# Patient Record
Sex: Female | Born: 1965
Health system: Southern US, Community
[De-identification: ages and names within clinical notes are randomized; demographics above are authoritative.]

## PROBLEM LIST (undated history)

## (undated) DIAGNOSIS — E876 Hypokalemia: Secondary | ICD-10-CM

## (undated) DIAGNOSIS — I10 Essential (primary) hypertension: Secondary | ICD-10-CM

## (undated) DIAGNOSIS — E669 Obesity, unspecified: Secondary | ICD-10-CM

## (undated) DIAGNOSIS — E119 Type 2 diabetes mellitus without complications: Secondary | ICD-10-CM

## (undated) DIAGNOSIS — M75101 Unspecified rotator cuff tear or rupture of right shoulder, not specified as traumatic: Secondary | ICD-10-CM

## (undated) HISTORY — PX: ROTATOR CUFF REPAIR: SHX139

## (undated) HISTORY — DX: Essential (primary) hypertension: I10

## (undated) HISTORY — PX: TUBAL LIGATION: SHX77

## (undated) HISTORY — PX: ENDOMETRIAL ABLATION: SHX621

---

## 2010-08-27 ENCOUNTER — Ambulatory Visit: Payer: Self-pay | Admitting: Radiology

## 2010-08-27 ENCOUNTER — Emergency Department (HOSPITAL_BASED_OUTPATIENT_CLINIC_OR_DEPARTMENT_OTHER)
Admission: EM | Admit: 2010-08-27 | Discharge: 2010-08-27 | Payer: Self-pay | Source: Home / Self Care | Admitting: Emergency Medicine

## 2011-01-05 LAB — DIFFERENTIAL
Basophils Relative: 2 % — ABNORMAL HIGH (ref 0–1)
Eosinophils Absolute: 0.2 10*3/uL (ref 0.0–0.7)
Lymphs Abs: 3.3 10*3/uL (ref 0.7–4.0)
Monocytes Absolute: 1.1 10*3/uL — ABNORMAL HIGH (ref 0.1–1.0)
Monocytes Relative: 8 % (ref 3–12)
Neutro Abs: 9.1 10*3/uL — ABNORMAL HIGH (ref 1.7–7.7)
Neutrophils Relative %: 66 % (ref 43–77)

## 2011-01-05 LAB — POCT CARDIAC MARKERS
CKMB, poc: 1.1 ng/mL (ref 1.0–8.0)
CKMB, poc: 1.5 ng/mL (ref 1.0–8.0)
Myoglobin, poc: 72.6 ng/mL (ref 12–200)
Myoglobin, poc: 89.3 ng/mL (ref 12–200)

## 2011-01-05 LAB — CBC
HCT: 39.4 % (ref 36.0–46.0)
Hemoglobin: 13.4 g/dL (ref 12.0–15.0)
MCH: 28.1 pg (ref 26.0–34.0)
MCHC: 34 g/dL (ref 30.0–36.0)
RBC: 4.78 MIL/uL (ref 3.87–5.11)

## 2011-01-05 LAB — BASIC METABOLIC PANEL
CO2: 25 mEq/L (ref 19–32)
Calcium: 9.9 mg/dL (ref 8.4–10.5)
Chloride: 105 mEq/L (ref 96–112)
GFR calc Af Amer: >60 mL/min (ref >60)
Glucose, Bld: 111 mg/dL — ABNORMAL HIGH (ref 70–99)
Potassium: 3.7 mEq/L (ref 3.5–5.1)
Sodium: 145 mEq/L (ref 135–145)

## 2011-04-23 ENCOUNTER — Emergency Department (HOSPITAL_BASED_OUTPATIENT_CLINIC_OR_DEPARTMENT_OTHER)
Admission: EM | Admit: 2011-04-23 | Discharge: 2011-04-23 | Disposition: A | Discharge status: Home / Self Care | Payer: PRIVATE HEALTH INSURANCE | Attending: Emergency Medicine | Admitting: Emergency Medicine

## 2011-04-23 ENCOUNTER — Emergency Department (INDEPENDENT_AMBULATORY_CARE_PROVIDER_SITE_OTHER): Payer: PRIVATE HEALTH INSURANCE

## 2011-04-23 DIAGNOSIS — M79609 Pain in unspecified limb: Secondary | ICD-10-CM

## 2011-04-23 DIAGNOSIS — S92309A Fracture of unspecified metatarsal bone(s), unspecified foot, initial encounter for closed fracture: Secondary | ICD-10-CM | POA: Insufficient documentation

## 2011-04-23 DIAGNOSIS — Y92009 Unspecified place in unspecified non-institutional (private) residence as the place of occurrence of the external cause: Secondary | ICD-10-CM | POA: Insufficient documentation

## 2011-04-23 DIAGNOSIS — X500XXA Overexertion from strenuous movement or load, initial encounter: Secondary | ICD-10-CM | POA: Insufficient documentation

## 2011-04-23 DIAGNOSIS — W19XXXA Unspecified fall, initial encounter: Secondary | ICD-10-CM

## 2011-04-23 DIAGNOSIS — I10 Essential (primary) hypertension: Secondary | ICD-10-CM | POA: Insufficient documentation

## 2011-04-23 DIAGNOSIS — K219 Gastro-esophageal reflux disease without esophagitis: Secondary | ICD-10-CM | POA: Insufficient documentation

## 2011-04-27 ENCOUNTER — Encounter: Payer: Self-pay | Admitting: Family Medicine

## 2011-04-27 ENCOUNTER — Ambulatory Visit (INDEPENDENT_AMBULATORY_CARE_PROVIDER_SITE_OTHER): Payer: PRIVATE HEALTH INSURANCE | Admitting: Family Medicine

## 2011-04-27 VITALS — BP 146/98 | HR 85 | Temp 98.1°F | Ht 65.0 in | Wt 226.6 lb

## 2011-04-27 DIAGNOSIS — S99199A Other physeal fracture of unspecified metatarsal, initial encounter for closed fracture: Secondary | ICD-10-CM

## 2011-04-27 DIAGNOSIS — S92309A Fracture of unspecified metatarsal bone(s), unspecified foot, initial encounter for closed fracture: Secondary | ICD-10-CM

## 2011-04-27 NOTE — Patient Instructions (Signed)
You have a [place]-type fracture of your 5th metatarsal of your foot. This can be treated without surgery IF you wear the boot at all times and put NO weight on this foot for 6-8 weeks. Must use either crutches or a knee walker to get around if this is the case. The other option if this will be too difficulty is to have surgery to put a screw into this bone to prevent the fracture site from moving - chances of this healing up in 6-8 weeks is higher if you have surgery but surgery has costs, risks associated with this. In your case, either option is reasonable. If going to try without surgery, I need to see you back in 1 week to repeat x-rays (and after that see you every 2 weeks for x-rays).

## 2011-04-27 NOTE — Progress Notes (Signed)
  Subjective:    Patient ID: Julie [name], female    DOB: 05-16-1966, 44 y.o.   MRN: [id number]  PCP: None  HPI 45 yo F here with right foot fracture.  Patient reports she was fishing on Friday, 6/29. She ran forward without shoes on to grab pole, inverted right foot and fell down. Immediate pain, swelling lateral aspect of right foot and couldn't bear weight. No prior right foot injuries or surgeries. Went to ED and diagnosed with 5th MT fracture - placed in boot, given crutches, and referred here. She is not wearing the boot because this is hot, too large for her.  No crutches here today either. Not icing - has been taking pain medicines from ED.  Past Medical History  Diagnosis Date  . Hypertension     No current outpatient prescriptions on file prior to visit.    History reviewed. No pertinent past surgical history.  No Known Allergies  History   Social History  . Marital Status: Married    Spouse Name: N/A    Number of Children: N/A  . Years of Education: N/A   Occupational History  . Not on file.   Social History Main Topics  . Smoking status: Never Smoker   . Smokeless tobacco: Not on file  . Alcohol Use: Not on file  . Drug Use: Not on file  . Sexually Active: Not on file   Other Topics Concern  . Not on file   Social History Narrative  . No narrative on file    Family History  Problem Relation Age of Onset  . Hypertension Mother   . Hypertension Father   . Diabetes Father   . Hypertension Sister   . Heart attack Neg Hx   . Sudden death Neg Hx   . Hyperlipidemia Neg Hx     BP 146/98  Pulse 85  Temp(Src) 98.1 F (36.7 C) (Oral)  Ht 5\' 5"  (1.651 m)  Wt 226 lb 9.6 oz (102.785 kg)  BMI 37.71 kg/m2  Review of Systems See HPI above.    Objective:   Physical Exam Gen: NAD R foot: Mod swelling lateral right foot.  No bruising, erythema, skin breakdown, other deformity. TTP base 5th MT.  No navicular, posterior med/lat malleoli, fibular  head, or other foot/ankle TTP. Able to dorsiflex and plantarflex foot - did not test extent of ROM or int/ext rotation with known Halm fracture. Neg talar tilt and ant drawer NVI distally with < 2 sec cap refill, 2+ dp pulses.     Assessment & Plan:  1. Right nondisplaced 5th MT [place] fracture - we reviewed the options (surgery vs non-weightbearing for 6-8 weeks) for management and patient would like to try non-surgical approach.  Stressed the extreme importance of not putting any weight on right foot even in boot (this is to protect the area in case of fall and to prevent any movement.  Ok to take off boot to ice only.  Crutches to help with ambulation.  Note for work - must be sitting and no weight put on right foot.  We will see her back in 1 week, repeat x-rays at that time, assess compliance with treatment protocol.  If not compliant, displacement > 2mm, or no evidence healing after 6 weeks, will refer for consideration of ORIF.  Patient agrees with treatment plan.

## 2011-04-27 NOTE — Assessment & Plan Note (Signed)
Right nondisplaced 5th MT [place] fracture - we reviewed the options (surgery vs non-weightbearing for 6-8 weeks) for management and patient would like to try non-surgical approach.  Stressed the extreme importance of not putting any weight on right foot even in boot (this is to protect the area in case of fall and to prevent any movement.  Ok to take off boot to ice only.  Crutches to help with ambulation.  Note for work - must be sitting and no weight put on right foot.  We will see her back in 1 week, repeat x-rays at that time, assess compliance with treatment protocol.  If not compliant, displacement > 2mm, or no evidence healing after 6 weeks, will refer for consideration of ORIF.  Patient agrees with treatment plan.

## 2011-05-06 ENCOUNTER — Encounter: Payer: Self-pay | Admitting: Family Medicine

## 2011-05-06 ENCOUNTER — Ambulatory Visit (INDEPENDENT_AMBULATORY_CARE_PROVIDER_SITE_OTHER): Payer: PRIVATE HEALTH INSURANCE | Admitting: Family Medicine

## 2011-05-06 ENCOUNTER — Ambulatory Visit (HOSPITAL_BASED_OUTPATIENT_CLINIC_OR_DEPARTMENT_OTHER)
Admission: RE | Admit: 2011-05-06 | Discharge: 2011-05-06 | Disposition: A | Discharge status: Home / Self Care | Payer: PRIVATE HEALTH INSURANCE | Source: Ambulatory Visit | Attending: Family Medicine | Admitting: Family Medicine

## 2011-05-06 VITALS — BP 141/90 | HR 81 | Temp 98.2°F | Ht 65.0 in | Wt 230.0 lb

## 2011-05-06 DIAGNOSIS — M79671 Pain in right foot: Secondary | ICD-10-CM

## 2011-05-06 DIAGNOSIS — IMO0001 Reserved for inherently not codable concepts without codable children: Secondary | ICD-10-CM | POA: Insufficient documentation

## 2011-05-06 DIAGNOSIS — S99199A Other physeal fracture of unspecified metatarsal, initial encounter for closed fracture: Secondary | ICD-10-CM

## 2011-05-06 DIAGNOSIS — S92309A Fracture of unspecified metatarsal bone(s), unspecified foot, initial encounter for closed fracture: Secondary | ICD-10-CM

## 2011-05-06 DIAGNOSIS — M79609 Pain in unspecified limb: Secondary | ICD-10-CM

## 2011-05-06 NOTE — Patient Instructions (Signed)
You have a [place]-type fracture of your 5th metatarsal of your foot. Wear boot at all times, try not to put any weight on it as much as possible. Must use either crutches. I will discuss your case with one of my colleagues. Ice your foot 3-4 times a day 15 minutes at a time for swelling and pain. Elevate as much as possible. Follow up with me in 2 weeks for a recheck and to repeat x-rays.

## 2011-05-06 NOTE — Progress Notes (Addendum)
Subjective:    Patient ID: Julie [name], female    DOB: [birth year], 45 y.o.   MRN: [id number]  PCP: None  HPI  45 yo F here for 9 day f/u right [place] fracture.  Last OV 7/3: Patient reports she was fishing on Friday, 6/29. She ran forward without shoes on to grab pole, inverted right foot and fell down. Immediate pain, swelling lateral aspect of right foot and couldn't bear weight. No prior right foot injuries or surgeries. Went to ED and diagnosed with 5th MT fracture - placed in boot, given crutches, and referred here. She is not wearing the boot because this is hot, too large for her.  No crutches here today either. Not icing - has been taking pain medicines from ED.  Today 7/12: Patient has been wearing cam walker and using crutches. Despite warning of consequences and discussion that she needs to be non-weightbearing with this fracture, she states she has been putting weight on foot in cam walker. Reports with her job even with restrictions they would have to take her out of work where she wouldn't get paid or she has to be standing for her job. Pain is significantly better, now a 0/10, only mild soreness. Denies any prior foot fractures or injuries that she knows of.  Past Medical History  Diagnosis Date  . Hypertension     No current outpatient prescriptions on file prior to visit.    History reviewed. No pertinent past surgical history.  No Known Allergies  History   Social History  . Marital Status: Married    Spouse Name: N/A    Number of Children: N/A  . Years of Education: N/A   Occupational History  . Not on file.   Social History Main Topics  . Smoking status: Never Smoker   . Smokeless tobacco: Not on file  . Alcohol Use: Not on file  . Drug Use: Not on file  . Sexually Active: Not on file   Other Topics Concern  . Not on file   Social History Narrative  . No narrative on file    Family History  Problem Relation Age of Onset  .  Hypertension Mother   . Hypertension Father   . Diabetes Father   . Hypertension Sister   . Heart attack Neg Hx   . Sudden death Neg Hx   . Hyperlipidemia Neg Hx     BP 141/90  Pulse 81  Temp(Src) 98.2 F (36.8 C) (Oral)  Ht 5\' 5"  (1.651 m)  Wt 230 lb (104.327 kg)  BMI 38.27 kg/m2  Review of Systems  See HPI above.    Objective:   Physical Exam  Gen: NAD R foot: No swelling lateral right foot.  No bruising, erythema, skin breakdown, other deformity. Minimal TTP base 5th MT.  No navicular, posterior med/lat malleoli, fibular head, or other foot/ankle TTP. Able to dorsiflex and plantarflex foot - did not test extent of ROM or int/ext rotation with known Moler fracture. Neg talar tilt and ant drawer NVI distally with < 2 sec cap refill, 2+ dp pulses.     Assessment & Plan:  1. Right nondisplaced 5th MT [place] fracture - no displacement at fracture site but no callus formation.  Again reiterated need for her to be non-weight bearing for optimal treatment of this fracture.  This is complicated by patient not having insurance (she reports she wouldn't even be able to consider surgery financially until the end of August) and financially she  has to work in a job that has no desk work available (or seated work) without financial support if she were to be out of work.  Will try to treat conservatively but will review the case with one of my colleagues.  If after 6-8 weeks of conservative care no callus seen and patient has pain at fracture site, will need surgical intervention.    Addendum:  Discussed case with Dr. Darrick Penna - will attempt to treat conservatively as above especially with limited resources and options, patient's pain being mostly improved.  Spoke with patient regarding this and informed - she requested some pain medicine so will call in 40 hydrocodone/tylenol to her pharmacy [facility] [address] in [place].  To keep f/u with me in 2 weeks.  Patient agrees to treatment plan.

## 2011-05-06 NOTE — Assessment & Plan Note (Signed)
Right nondisplaced 5th MT [place] fracture - no displacement at fracture site but no callus formation.  Again reiterated need for her to be non-weight bearing for optimal treatment of this fracture.  This is complicated by patient not having insurance (she reports she wouldn't even be able to consider surgery financially until the end of August) and financially she has to work in a job that has no desk work available (or seated work) without financial support if she were to be out of work.  Will try to treat conservatively but will review the case with one of my colleagues.  If after 6-8 weeks of conservative care no callus seen and patient has pain at fracture site, will need surgical intervention.

## 2011-05-21 ENCOUNTER — Ambulatory Visit (INDEPENDENT_AMBULATORY_CARE_PROVIDER_SITE_OTHER): Payer: PRIVATE HEALTH INSURANCE | Admitting: Family Medicine

## 2011-05-21 ENCOUNTER — Ambulatory Visit: Payer: PRIVATE HEALTH INSURANCE | Admitting: Family Medicine

## 2011-05-21 ENCOUNTER — Encounter: Payer: Self-pay | Admitting: Family Medicine

## 2011-05-21 ENCOUNTER — Ambulatory Visit (HOSPITAL_BASED_OUTPATIENT_CLINIC_OR_DEPARTMENT_OTHER)
Admission: RE | Admit: 2011-05-21 | Discharge: 2011-05-21 | Disposition: A | Discharge status: Home / Self Care | Payer: PRIVATE HEALTH INSURANCE | Source: Ambulatory Visit | Attending: Family Medicine | Admitting: Family Medicine

## 2011-05-21 VITALS — BP 119/86 | HR 72 | Temp 97.4°F | Ht 65.0 in | Wt 230.0 lb

## 2011-05-21 DIAGNOSIS — M79671 Pain in right foot: Secondary | ICD-10-CM

## 2011-05-21 DIAGNOSIS — Z4789 Encounter for other orthopedic aftercare: Secondary | ICD-10-CM | POA: Insufficient documentation

## 2011-05-21 DIAGNOSIS — M79609 Pain in unspecified limb: Secondary | ICD-10-CM

## 2011-05-21 DIAGNOSIS — S99199A Other physeal fracture of unspecified metatarsal, initial encounter for closed fracture: Secondary | ICD-10-CM

## 2011-05-21 DIAGNOSIS — S92309A Fracture of unspecified metatarsal bone(s), unspecified foot, initial encounter for closed fracture: Secondary | ICD-10-CM

## 2011-05-21 NOTE — Progress Notes (Signed)
Subjective:    Patient ID: Julie [name], female    DOB: 1966-02-13, 45 y.o.   MRN: [id number]  PCP: None  HPI  45 yo F here for 4 week f/u right Fay fracture.  7/3: Patient reports she was fishing on Friday, 6/29. She ran forward without shoes on to grab pole, inverted right foot and fell down. Immediate pain, swelling lateral aspect of right foot and couldn't bear weight. No prior right foot injuries or surgeries. Went to ED and diagnosed with 5th MT fracture - placed in boot, given crutches, and referred here. She is not wearing the boot because this is hot, too large for her.  No crutches here today either. Not icing - has been taking pain medicines from ED.  7/12: Patient has been wearing cam walker and using crutches. Despite warning of consequences and discussion that she needs to be non-weightbearing with this fracture, she states she has been putting weight on foot in cam walker. Reports with her job even with restrictions they would have to take her out of work where she wouldn't get paid or she has to be standing for her job. Pain is significantly better, now a 0/10, only mild soreness. Denies any prior foot fractures or injuries that she knows of.  7/27: Patient is now about 4 weeks out from initial injury. She still reports 0/10 pain with only mild soreness. As noted before, patient is in a tough position - needs to work to make money but if placed on restrictions, they would take her out of work. Despite not being completely adherent to medical treatment, she wanted to try non-operative treatment for [place] fracture. She is using crutches and wearing cam walker but not using crutches all the time. Has vicodin as needed for severe pain. No longer icing.  Past Medical History  Diagnosis Date  . Hypertension     No current outpatient prescriptions on file prior to visit.    History reviewed. No pertinent past surgical history.  No Known Allergies  History     Social History  . Marital Status: Married    Spouse Name: N/A    Number of Children: N/A  . Years of Education: N/A   Occupational History  . Not on file.   Social History Main Topics  . Smoking status: Never Smoker   . Smokeless tobacco: Not on file  . Alcohol Use: Not on file  . Drug Use: Not on file  . Sexually Active: Not on file   Other Topics Concern  . Not on file   Social History Narrative  . No narrative on file    Family History  Problem Relation Age of Onset  . Hypertension Mother   . Hypertension Father   . Diabetes Father   . Hypertension Sister   . Heart attack Neg Hx   . Sudden death Neg Hx   . Hyperlipidemia Neg Hx     BP 119/86  Pulse 72  Temp(Src) 97.4 F (36.3 C) (Oral)  Ht 5\' 5"  (1.651 m)  Wt 230 lb (104.327 kg)  BMI 38.27 kg/m2  Review of Systems  See HPI above.    Objective:   Physical Exam  Gen: NAD R foot: No swelling lateral right foot.  No bruising, erythema, skin breakdown, other deformity. No TTP base 5th MT.  No navicular, posterior med/lat malleoli, fibular head, or other foot/ankle TTP. Able to dorsiflex and plantarflex foot - did not test extent of ROM or int/ext rotation with  known Maduro fracture. Neg talar tilt and ant drawer NVI distally with < 2 sec cap refill, 2+ dp pulses.     Assessment & Plan:  1. Right 5th MT [place] fracture - patient only describes occasional mild soreness at fracture site.  X-rays today show widening at fracture site though some bone bridging - no lateral callus formation.  Discussed my concern about x-ray appearance today.  Stressed importance of being non-weight bearing (ideally completely as we discussed previously).  Will see her for follow-up in 2 weeks when she is 6 weeks out.  But if at that point still having soreness and x-rays not improved at that point, I advised patient I would likely refer her for surgical consultation to consider ORIF.    At last visit patient reported to me she  wouldn't be able to consider surgery financially until the end of August.  She has to work in a job that has no desk work available (or seated work) without financial support if she were to be out of work.

## 2011-05-21 NOTE — Assessment & Plan Note (Signed)
Right 5th MT Ringer fracture - patient only describes occasional mild soreness at fracture site.  X-rays today show widening at fracture site though some bone bridging - no lateral callus formation.  Discussed my concern about x-ray appearance today.  Stressed importance of being non-weight bearing (ideally completely as we discussed previously).  Will see her for follow-up in 2 weeks when she is 6 weeks out.  But if at that point still having soreness and x-rays not improved at that point, I advised patient I would likely refer her for surgical consultation to consider ORIF.    At last visit patient reported to me she wouldn't be able to consider surgery financially until the end of August.  She has to work in a job that has no desk work available (or seated work) without financial support if she were to be out of work.

## 2011-06-04 ENCOUNTER — Ambulatory Visit (INDEPENDENT_AMBULATORY_CARE_PROVIDER_SITE_OTHER): Payer: PRIVATE HEALTH INSURANCE | Admitting: Family Medicine

## 2011-06-04 ENCOUNTER — Encounter: Payer: Self-pay | Admitting: Family Medicine

## 2011-06-04 ENCOUNTER — Ambulatory Visit (HOSPITAL_BASED_OUTPATIENT_CLINIC_OR_DEPARTMENT_OTHER)
Admission: RE | Admit: 2011-06-04 | Discharge: 2011-06-04 | Disposition: A | Discharge status: Home / Self Care | Payer: PRIVATE HEALTH INSURANCE | Source: Ambulatory Visit | Attending: Family Medicine | Admitting: Family Medicine

## 2011-06-04 ENCOUNTER — Encounter: Payer: Self-pay | Admitting: *Deleted

## 2011-06-04 VITALS — BP 149/98 | HR 72 | Temp 98.1°F | Ht 65.0 in | Wt 234.0 lb

## 2011-06-04 DIAGNOSIS — X58XXXA Exposure to other specified factors, initial encounter: Secondary | ICD-10-CM | POA: Insufficient documentation

## 2011-06-04 DIAGNOSIS — M79671 Pain in right foot: Secondary | ICD-10-CM

## 2011-06-04 DIAGNOSIS — IMO0002 Reserved for concepts with insufficient information to code with codable children: Secondary | ICD-10-CM

## 2011-06-04 DIAGNOSIS — S99199A Other physeal fracture of unspecified metatarsal, initial encounter for closed fracture: Secondary | ICD-10-CM

## 2011-06-04 DIAGNOSIS — M79609 Pain in unspecified limb: Secondary | ICD-10-CM

## 2011-06-04 DIAGNOSIS — S8290XS Unspecified fracture of unspecified lower leg, sequela: Secondary | ICD-10-CM

## 2011-06-04 DIAGNOSIS — M19079 Primary osteoarthritis, unspecified ankle and foot: Secondary | ICD-10-CM | POA: Insufficient documentation

## 2011-06-04 DIAGNOSIS — M7730 Calcaneal spur, unspecified foot: Secondary | ICD-10-CM | POA: Insufficient documentation

## 2011-06-04 DIAGNOSIS — S92309A Fracture of unspecified metatarsal bone(s), unspecified foot, initial encounter for closed fracture: Secondary | ICD-10-CM

## 2011-06-04 NOTE — Assessment & Plan Note (Signed)
Right 5th MT [place] fracture - x-rays reviewed today, now 6 weeks out from initial injury and still without evidence of union.  While she has tried conservative approach (non-adherent despite stressing importance of being NWB on crutches) and she felt necessary to trial this given she couldn't consider surgery financially (and couldn't afford to be out of work) until late August, union unlikely at this point without ORIF.  Will refer her to orthopedics for discussion of surgical intervention.  She stated she prefers an afternoon appointment for initial consultation.

## 2011-06-04 NOTE — Progress Notes (Signed)
Subjective:    Patient ID: Julie [name], female    DOB: Mar 08, 1966, 45 y.o.   MRN: [id number]  PCP: None  HPI  45 yo F here for 4 week f/u right Bieker fracture.  7/3: Patient reports she was fishing on Friday, 6/29. She ran forward without shoes on to grab pole, inverted right foot and fell down. Immediate pain, swelling lateral aspect of right foot and couldn't bear weight. No prior right foot injuries or surgeries. Went to ED and diagnosed with 5th MT fracture - placed in boot, given crutches, and referred here. She is not wearing the boot because this is hot, too large for her.  No crutches here today either. Not icing - has been taking pain medicines from ED.  7/12: Patient has been wearing cam walker and using crutches. Despite warning of consequences and discussion that she needs to be non-weightbearing with this fracture, she states she has been putting weight on foot in cam walker. Reports with her job even with restrictions they would have to take her out of work where she wouldn't get paid or she has to be standing for her job. Pain is significantly better, now a 0/10, only mild soreness. Denies any prior foot fractures or injuries that she knows of.  7/27: Patient is now about 4 weeks out from initial injury. She still reports 0/10 pain with only mild soreness. As noted before, patient is in a tough position - needs to work to make money but if placed on restrictions, they would take her out of work. Despite not being completely adherent to medical treatment, she wanted to try non-operative treatment for [name] fracture. She is using crutches and wearing cam walker but not using crutches all the time. Has vicodin as needed for severe pain. No longer icing.  8/10: No real change since last visit. Patient reports 0/10 pain but with intermittent pain at fracture site. As noted before, patient is in a tough position - needs to work to make money but if placed on  restrictions, they would take her out of work.  She noted that she believes her benefits would kick in in 2 weeks at which point she would be able to have surgery and be out of work without fear of losing her job. Still wearing cam walker, only using one crutch currently, not completely adherent to non-weight bearing though this has been stressed. Not taking any medicine for pain currently.  Past Medical History  Diagnosis Date  . Hypertension     No current outpatient prescriptions on file prior to visit.    History reviewed. No pertinent past surgical history.  No Known Allergies  History   Social History  . Marital Status: Married    Spouse Name: N/A    Number of Children: N/A  . Years of Education: N/A   Occupational History  . Not on file.   Social History Main Topics  . Smoking status: Never Smoker   . Smokeless tobacco: Not on file  . Alcohol Use: Not on file  . Drug Use: Not on file  . Sexually Active: Not on file   Other Topics Concern  . Not on file   Social History Narrative  . No narrative on file    Family History  Problem Relation Age of Onset  . Hypertension Mother   . Hypertension Father   . Diabetes Father   . Hypertension Sister   . Heart attack Neg Hx   . Sudden death  Neg Hx   . Hyperlipidemia Neg Hx     BP 149/98  Pulse 72  Temp(Src) 98.1 F (36.7 C) (Oral)  Ht 5\' 5"  (1.651 m)  Wt 234 lb (106.142 kg)  BMI 38.94 kg/m2  Review of Systems  See HPI above.    Objective:   Physical Exam  Gen: NAD R foot: No swelling lateral right foot.  No bruising, erythema, skin breakdown, other deformity. Mild TTP base 5th MT.  No navicular, posterior med/lat malleoli, fibular head, or other foot/ankle TTP. Able to dorsiflex and plantarflex foot - did not test extent of ROM or int/ext rotation with known Tony fracture. Neg talar tilt and ant drawer. NVI distally with < 2 sec cap refill, 2+ dp pulses.     Assessment & Plan:  1. Right 5th MT  Persons fracture - x-rays reviewed today, now 6 weeks out from initial injury and still without evidence of union.  While she has tried conservative approach (non-adherent despite stressing importance of being NWB on crutches) and she felt necessary to trial this given she couldn't consider surgery financially (and couldn't afford to be out of work) until late August, union unlikely at this point without ORIF.  Will refer her to orthopedics for discussion of surgical intervention.  She stated she prefers an afternoon appointment for initial consultation.

## 2011-06-04 NOTE — Patient Instructions (Addendum)
Orthopaedic appt Dr Rondall Allegra, August 14th @ 5pm [address] [place], [place]. Pt informed via phone

## 2011-07-05 ENCOUNTER — Emergency Department (HOSPITAL_BASED_OUTPATIENT_CLINIC_OR_DEPARTMENT_OTHER)
Admission: EM | Admit: 2011-07-05 | Discharge: 2011-07-06 | Disposition: A | Discharge status: Home / Self Care | Payer: PRIVATE HEALTH INSURANCE | Attending: Emergency Medicine | Admitting: Emergency Medicine

## 2011-07-05 ENCOUNTER — Encounter (HOSPITAL_BASED_OUTPATIENT_CLINIC_OR_DEPARTMENT_OTHER): Payer: Self-pay

## 2011-07-05 DIAGNOSIS — M7989 Other specified soft tissue disorders: Secondary | ICD-10-CM | POA: Insufficient documentation

## 2011-07-05 DIAGNOSIS — M25559 Pain in unspecified hip: Secondary | ICD-10-CM | POA: Insufficient documentation

## 2011-07-05 DIAGNOSIS — I10 Essential (primary) hypertension: Secondary | ICD-10-CM | POA: Insufficient documentation

## 2011-07-05 NOTE — ED Notes (Signed)
Being treated for foot fx since June-states she wears "the boot" when working-no boot in place upon arrival-c/o swelling to right thigh

## 2011-07-06 MED ORDER — NAPROXEN 500 MG PO TABS: 500.0000 mg | ORAL_TABLET | Freq: Two times a day (BID) | ORAL | Status: AC

## 2011-07-06 NOTE — ED Provider Notes (Signed)
History  Scribed for Dr. Deretha Emory, the patient was seen in room 2. The chart was scribed by Gilman Schmidt. The patients care was started at 1359.  CSN: [id number] Arrival date & time: 07/05/2011 10:16 PM  Chief Complaint  Patient presents with  . Leg Swelling   HPI Julie [name] is a 45 y.o. female who presents to the Emergency Department complaining of right leg swelling. Pt reports that she fractured her right leg in June. States that she wears cast boot but reports that she cannot drive with boot and is not currently wearing it. Additionally, patient reports tightness in right calf. Denies any CP, difficulty breathing, or abdominal pain. There are no other associated symptoms and no other alleviating or aggravating factors.   HPI ELEMENTS:  Location: left leg Duration: persistent since onset Timing: constant  Context: as above  Associated symptoms: denies any CP, difficulty breathing, or abdominal pain.   PAST MEDICAL HISTORY:  Past Medical History  Diagnosis Date  . Hypertension      PAST SURGICAL HISTORY:  History reviewed. No pertinent past surgical history.   MEDICATIONS:  Previous Medications   METOPROLOL-HYDROCHLOROTHIAZIDE (LOPRESSOR HCT) 50-25 MG PER TABLET    Take 1 tablet by mouth daily.       ALLERGIES:  Allergies as of 07/05/2011  . (No Known Allergies)     FAMILY HISTORY:  Family History  Problem Relation Age of Onset  . Hypertension Mother   . Hypertension Father   . Diabetes Father   . Hypertension Sister   . Heart attack Neg Hx   . Sudden death Neg Hx   . Hyperlipidemia Neg Hx      SOCIAL HISTORY: History  Substance Use Topics  . Smoking status: Never Smoker   . Smokeless tobacco: Not on file  . Alcohol Use: No      Review of Systems  Respiratory:       Denies difficulty breathing  Cardiovascular: Negative for chest pain.  Gastrointestinal: Negative for abdominal pain.  Musculoskeletal:       Right thigh swelling    Physical Exam    BP 149/80  Pulse 76  Temp(Src) 98.1 F (36.7 C) (Oral)  Resp 18  Ht 5\' 5"  (1.651 m)  Wt 230 lb (104.327 kg)  BMI 38.27 kg/m2  SpO2 100%  LMP 06/26/2011  Physical Exam  Nursing note and vitals reviewed. Constitutional: She is oriented to person, place, and time. She appears well-developed and well-nourished.  HENT:  Head: Normocephalic and atraumatic.  Eyes: Conjunctivae and EOM are normal. Pupils are equal, round, and reactive to light.  Neck: Normal range of motion. Neck supple.  Cardiovascular: Normal rate, regular rhythm, normal heart sounds and intact distal pulses.   Pulmonary/Chest: Effort normal and breath sounds normal.  Abdominal: Soft. Bowel sounds are normal.  Musculoskeletal: Normal range of motion.  Neurological: She is alert and oriented to person, place, and time.  Skin: Skin is warm and dry.  Psychiatric: She has a normal mood and affect.   OTHER DATA REVIEWED: Nursing notes, vital signs, and past medical records reviewed.   DIAGNOSTIC STUDIES: Oxygen Saturation is 100% on room air, normal by my interpretation.     MDM:   SUSPECT RIGHT INNER THIGH MUSCLE STRAIN SWELLING FROM USING RIGHT LEG DIFFERENTLY DUE TO RIGHT FOOT FRACTURE. HAS ORTHO FOLLOW UP, NO ANKLE OR CALF SWELLING THEREFOR DOUBT DVT SINCE NOW DISTAL SWELLING.   IMPRESSION: Diagnoses that have been ruled out:  Diagnoses that are still  under consideration:  Final diagnoses:    PLAN:  Home. The patient is to return the emergency department if there is any worsening of symptoms. I have reviewed the discharge instructions with the patient.  CONDITION ON DISCHARGE: Stable  MEDICATIONS GIVEN IN THE E.D.  Medications  metoprolol-hydrochlorothiazide (LOPRESSOR HCT) 50-25 MG per tablet (not administered)    DISCHARGE MEDICATIONS: New Prescriptions   No medications on file    SCRIBE ATTESTATION:  I personally performed the services described in this documentation, which was scribed  in my presence. The recorded information has been reviewed and considered. No att. providers found    Procedures        Shelda Jakes, MD 07/06/11 [phone]

## 2011-08-31 ENCOUNTER — Emergency Department (HOSPITAL_BASED_OUTPATIENT_CLINIC_OR_DEPARTMENT_OTHER)
Admission: EM | Admit: 2011-08-31 | Discharge: 2011-08-31 | Disposition: A | Discharge status: Home / Self Care | Payer: PRIVATE HEALTH INSURANCE | Attending: Emergency Medicine | Admitting: Emergency Medicine

## 2011-08-31 ENCOUNTER — Encounter (HOSPITAL_BASED_OUTPATIENT_CLINIC_OR_DEPARTMENT_OTHER): Payer: Self-pay

## 2011-08-31 ENCOUNTER — Emergency Department (INDEPENDENT_AMBULATORY_CARE_PROVIDER_SITE_OTHER): Payer: PRIVATE HEALTH INSURANCE

## 2011-08-31 DIAGNOSIS — R05 Cough: Secondary | ICD-10-CM

## 2011-08-31 DIAGNOSIS — R110 Nausea: Secondary | ICD-10-CM | POA: Insufficient documentation

## 2011-08-31 DIAGNOSIS — R61 Generalized hyperhidrosis: Secondary | ICD-10-CM

## 2011-08-31 DIAGNOSIS — IMO0001 Reserved for inherently not codable concepts without codable children: Secondary | ICD-10-CM | POA: Insufficient documentation

## 2011-08-31 DIAGNOSIS — M791 Myalgia, unspecified site: Secondary | ICD-10-CM

## 2011-08-31 DIAGNOSIS — R070 Pain in throat: Secondary | ICD-10-CM

## 2011-08-31 DIAGNOSIS — R42 Dizziness and giddiness: Secondary | ICD-10-CM

## 2011-08-31 LAB — URINALYSIS, ROUTINE W REFLEX MICROSCOPIC
Bilirubin Urine: NEGATIVE
Glucose, UA: NEGATIVE mg/dL
Ketones, ur: NEGATIVE mg/dL
Protein, ur: NEGATIVE mg/dL
pH: 7.5 (ref 5.0–8.0)

## 2011-08-31 MED ORDER — PROMETHAZINE HCL 25 MG PO TABS: 25.0000 mg | ORAL_TABLET | Freq: Four times a day (QID) | ORAL | Status: AC | PRN

## 2011-08-31 NOTE — ED Notes (Signed)
Pt reports generalized body aches, nausea and sweats that started on Saturday.

## 2011-08-31 NOTE — ED Provider Notes (Signed)
History     CSN: [id number] Arrival date & time: 08/31/2011  2:18 PM   First MD Initiated Contact with Patient 08/31/11 1413      Chief Complaint  Patient presents with  . Generalized Body Aches  . Nausea    (Consider location/radiation/quality/duration/timing/severity/associated sxs/prior treatment) Patient is a 45 y.o. female presenting with URI. The history is provided by the patient. No language interpreter was used.  URI The primary symptoms include headaches, sore throat, nausea and myalgias. Primary symptoms do not include fever or cough. The current episode started 3 to 5 days ago. This is a new problem. The problem has not changed since onset. Symptoms associated with the illness include chills. The illness is not associated with facial pain, sinus pressure, congestion or rhinorrhea.    Past Medical History  Diagnosis Date  . Hypertension     Past Surgical History  Procedure Date  . Tubal ligation     Family History  Problem Relation Age of Onset  . Hypertension Mother   . Hypertension Father   . Diabetes Father   . Hypertension Sister   . Heart attack Neg Hx   . Sudden death Neg Hx   . Hyperlipidemia Neg Hx     History  Substance Use Topics  . Smoking status: Never Smoker   . Smokeless tobacco: Not on file  . Alcohol Use: No    OB History    Grav Para Term Preterm Abortions TAB SAB Ect Mult Living                  Review of Systems  Constitutional: Positive for chills. Negative for fever.  HENT: Positive for sore throat. Negative for congestion, rhinorrhea and sinus pressure.   Respiratory: Negative for cough.   Gastrointestinal: Positive for nausea.  Musculoskeletal: Positive for myalgias.  Neurological: Positive for headaches.  All other systems reviewed and are negative.    Allergies  Review of patient's allergies indicates no known allergies.  Home Medications   Current Outpatient Rx  Name Route Sig Dispense Refill  .  METOPROLOL-HYDROCHLOROTHIAZIDE 50-25 MG PO TABS Oral Take 1 tablet by mouth daily.      Marland Kitchen NAPROXEN 500 MG PO TABS Oral Take 1 tablet (500 mg total) by mouth 2 (two) times daily. 14 tablet 0    BP 151/104  Pulse 72  Temp(Src) 97.9 F (36.6 C) (Oral)  Resp 18  Ht 5\' 5"  (1.651 m)  Wt 235 lb (106.595 kg)  BMI 39.11 kg/m2  SpO2 100%  LMP 06/28/2011  Physical Exam  Nursing note and vitals reviewed. Constitutional: She is oriented to person, place, and time. She appears well-developed and well-nourished.  HENT:  Head: Normocephalic and atraumatic.  Right Ear: External ear normal.  Left Ear: External ear normal.  Mouth/Throat: Posterior oropharyngeal erythema present.  Eyes: Pupils are equal, round, and reactive to light.  Neck: Normal range of motion. Neck supple.  Cardiovascular: Normal rate and regular rhythm.   Pulmonary/Chest: Effort normal and breath sounds normal.  Abdominal: Soft. Bowel sounds are normal.  Musculoskeletal: Normal range of motion.  Neurological: She is alert and oriented to person, place, and time.  Skin: Skin is warm and dry.  Psychiatric: She has a normal mood and affect.    ED Course  Procedures (including critical care time)   Labs Reviewed  PREGNANCY, URINE  URINALYSIS, ROUTINE W REFLEX MICROSCOPIC   Dg Chest 2 View  08/31/2011  *RADIOLOGY REPORT*  Clinical Data: Cough, sore throat,  dizziness, sweating, history hypertension  CHEST - 2 VIEW  Comparison: 08/27/2010  Findings: Upper-normal size of cardiac silhouette. Mediastinal contours and pulmonary vascularity normal. Lungs clear. No pleural effusion or pneumothorax. No acute osseous findings.  IMPRESSION: No acute abnormalities.  Original Report Authenticated By: Lollie Marrow, M.D.     1. Nausea   2. Myalgia       MDM  No acute findings here:pt is non septic in appearance:pt is tolerating po here without any problem:pt is requesting something for nausea at home   Medical screening  examination/treatment/procedure(s) were performed by non-physician practitioner and as supervising physician I was immediately available for consultation/collaboration. Osvaldo Human, M.D.     Teressa Lower, NP 08/31/11 1531  Carleene Cooper III, MD 09/01/11 2101

## 2011-10-24 ENCOUNTER — Encounter (HOSPITAL_BASED_OUTPATIENT_CLINIC_OR_DEPARTMENT_OTHER): Payer: Self-pay

## 2011-10-24 ENCOUNTER — Emergency Department (HOSPITAL_BASED_OUTPATIENT_CLINIC_OR_DEPARTMENT_OTHER)
Admission: EM | Admit: 2011-10-24 | Discharge: 2011-10-24 | Disposition: A | Discharge status: Home / Self Care | Payer: PRIVATE HEALTH INSURANCE | Attending: Emergency Medicine | Admitting: Emergency Medicine

## 2011-10-24 DIAGNOSIS — I10 Essential (primary) hypertension: Secondary | ICD-10-CM | POA: Insufficient documentation

## 2011-10-24 DIAGNOSIS — B9789 Other viral agents as the cause of diseases classified elsewhere: Secondary | ICD-10-CM | POA: Insufficient documentation

## 2011-10-24 DIAGNOSIS — B349 Viral infection, unspecified: Secondary | ICD-10-CM

## 2011-10-24 DIAGNOSIS — IMO0001 Reserved for inherently not codable concepts without codable children: Secondary | ICD-10-CM | POA: Insufficient documentation

## 2011-10-24 DIAGNOSIS — R079 Chest pain, unspecified: Secondary | ICD-10-CM | POA: Insufficient documentation

## 2011-10-24 MED ORDER — IBUPROFEN 800 MG PO TABS
ORAL_TABLET | ORAL | Status: AC
  Administered 2011-10-24: 800 mg via ORAL
  Filled 2011-10-24: qty 1

## 2011-10-24 MED ORDER — IBUPROFEN 800 MG PO TABS
800.0000 mg | ORAL_TABLET | Freq: Once | ORAL | Status: AC
  Administered 2011-10-24: 800 mg via ORAL

## 2011-10-24 MED ORDER — OSELTAMIVIR PHOSPHATE 75 MG PO CAPS: 75.0000 mg | ORAL_CAPSULE | Freq: Two times a day (BID) | ORAL | Status: AC

## 2011-10-24 NOTE — ED Provider Notes (Signed)
History     CSN: [id number]  Arrival date & time 10/24/11  1403   First MD Initiated Contact with Patient 10/24/11 1441      Chief Complaint  Patient presents with  . Generalized Body Aches    (Consider location/radiation/quality/duration/timing/severity/associated sxs/prior treatment) Patient is a 45 y.o. female presenting with cough. The history is provided by the patient. No language interpreter was used.  Cough This is a new problem. The current episode started more than 2 days ago. The problem occurs constantly. The cough is non-productive. The maximum temperature recorded prior to her arrival was 101 to 101.9 F. The fever has been present for 1 to 2 days. Associated symptoms include chest pain, rhinorrhea and sore throat. She has tried nothing for the symptoms. She is not a smoker. Her past medical history does not include pneumonia.  Pt complains of a cough and bodyaches for 2 days.    Past Medical History  Diagnosis Date  . Hypertension     Past Surgical History  Procedure Date  . Tubal ligation     Family History  Problem Relation Age of Onset  . Hypertension Mother   . Hypertension Father   . Diabetes Father   . Hypertension Sister   . Heart attack Neg Hx   . Sudden death Neg Hx   . Hyperlipidemia Neg Hx     History  Substance Use Topics  . Smoking status: Never Smoker   . Smokeless tobacco: Not on file  . Alcohol Use: No    OB History    Grav Para Term Preterm Abortions TAB SAB Ect Mult Living                  Review of Systems  HENT: Positive for sore throat and rhinorrhea.   Respiratory: Positive for cough.   Cardiovascular: Positive for chest pain.  All other systems reviewed and are negative.    Allergies  Review of patient's allergies indicates no known allergies.  Home Medications   Current Outpatient Rx  Name Route Sig Dispense Refill  . ACETAMINOPHEN 500 MG PO TABS Oral Take 500 mg by mouth every 6 (six) hours as needed. For  pain     . METOPROLOL-HYDROCHLOROTHIAZIDE 50-25 MG PO TABS Oral Take 1 tablet by mouth daily.      Marland Kitchen NAPROXEN 500 MG PO TABS Oral Take 1 tablet (500 mg total) by mouth 2 (two) times daily. 14 tablet 0    BP 142/104  Pulse 132  Temp(Src) 101.2 F (38.4 C) (Oral)  Resp 18  Ht 5\' 5"  (1.651 m)  Wt 230 lb (104.327 kg)  BMI 38.27 kg/m2  SpO2 100%  LMP 10/23/2011  Physical Exam  Nursing note and vitals reviewed. Constitutional: She is oriented to person, place, and time. She appears well-developed and well-nourished.  HENT:  Head: Normocephalic and atraumatic.  Right Ear: External ear normal.  Left Ear: External ear normal.  Nose: Nose normal.  Mouth/Throat: Oropharynx is clear and moist.  Eyes: Conjunctivae and EOM are normal. Pupils are equal, round, and reactive to light.  Neck: Normal range of motion. Neck supple.  Cardiovascular: Normal rate.   Pulmonary/Chest: Effort normal.  Abdominal: Soft.  Musculoskeletal: Normal range of motion.  Neurological: She is alert and oriented to person, place, and time. She has normal reflexes.  Skin: Skin is warm.  Psychiatric: She has a normal mood and affect.    ED Course  Procedures (including critical care time)  Labs Reviewed -  No data to display No results found.   No diagnosis found.    MDM  I suspect viral illness,  Pt did not have a flu shot.       Langston Masker, [place] 10/24/11 [phone]

## 2011-10-24 NOTE — ED Notes (Signed)
Body aches, cough x 2 days

## 2011-10-25 NOTE — ED Provider Notes (Signed)
History/physical exam/procedure(s) were performed by non-physician practitioner and as supervising physician I was immediately available for consultation/collaboration. I have reviewed all notes and am in agreement with care and plan.   James Senn S Wilford Merryfield, MD 10/25/11 1611 

## 2013-07-17 ENCOUNTER — Encounter (HOSPITAL_BASED_OUTPATIENT_CLINIC_OR_DEPARTMENT_OTHER): Payer: Self-pay | Admitting: Emergency Medicine

## 2013-07-17 ENCOUNTER — Emergency Department (HOSPITAL_BASED_OUTPATIENT_CLINIC_OR_DEPARTMENT_OTHER)
Admission: EM | Admit: 2013-07-17 | Discharge: 2013-07-17 | Disposition: A | Discharge status: Home / Self Care | Payer: PRIVATE HEALTH INSURANCE | Attending: Emergency Medicine | Admitting: Emergency Medicine

## 2013-07-17 ENCOUNTER — Emergency Department (HOSPITAL_BASED_OUTPATIENT_CLINIC_OR_DEPARTMENT_OTHER): Payer: PRIVATE HEALTH INSURANCE

## 2013-07-17 DIAGNOSIS — M549 Dorsalgia, unspecified: Secondary | ICD-10-CM | POA: Insufficient documentation

## 2013-07-17 DIAGNOSIS — M25569 Pain in unspecified knee: Secondary | ICD-10-CM | POA: Insufficient documentation

## 2013-07-17 DIAGNOSIS — M25469 Effusion, unspecified knee: Secondary | ICD-10-CM | POA: Insufficient documentation

## 2013-07-17 DIAGNOSIS — Z79899 Other long term (current) drug therapy: Secondary | ICD-10-CM | POA: Insufficient documentation

## 2013-07-17 DIAGNOSIS — R51 Headache: Secondary | ICD-10-CM | POA: Insufficient documentation

## 2013-07-17 DIAGNOSIS — M25561 Pain in right knee: Secondary | ICD-10-CM

## 2013-07-17 DIAGNOSIS — I10 Essential (primary) hypertension: Secondary | ICD-10-CM | POA: Insufficient documentation

## 2013-07-17 MED ORDER — HYDROCODONE-ACETAMINOPHEN 5-325 MG PO TABS: 1.0000 | ORAL_TABLET | ORAL | Status: DC | PRN

## 2013-07-17 MED ORDER — IBUPROFEN 800 MG PO TABS
800.0000 mg | ORAL_TABLET | Freq: Once | ORAL | Status: AC
  Administered 2013-07-17: 800 mg via ORAL
  Filled 2013-07-17: qty 1

## 2013-07-17 MED ORDER — IBUPROFEN 800 MG PO TABS: 800.0000 mg | ORAL_TABLET | Freq: Three times a day (TID) | ORAL | Status: DC

## 2013-07-17 NOTE — ED Provider Notes (Signed)
CSN: [id number]     Arrival date & time 07/17/13  1844 History   This chart was scribed for Audree Camel, MD by Valera Castle, ED Scribe. This patient was seen in room MH11/MH11 and the patient's care was started at 7:26 PM.     Chief Complaint  Patient presents with  . Knee Pain    Patient is a 47 y.o. female presenting with knee pain. The history is provided by the patient. No language interpreter was used.  Knee Pain Location:  Knee Time since incident:  1 week Knee location:  R knee Pain details:    Radiates to:  Does not radiate   Severity:  Moderate   Onset quality:  Gradual   Duration:  1 week   Timing:  Constant   Progression:  Worsening Chronicity:  New Associated symptoms: back pain, decreased ROM and swelling   Associated symptoms: no fever, no muscle weakness and no numbness    HPI Comments: Julie [name] is a 47 y.o. female with a h/o hypertension who presents to the Emergency Department complaining of gradual, moderate, constant right knee pain, with associated swelling and redness, onset 1 week ago, with no known injury. She reports associated headaches and back pain. She reports that the knee pain is worsened when she ambulates and tries to bend it. She reports taking tylenol and iburpofen, with no relief.  She denies any h/o of knee problems. She denies fever, or any other associated symptoms. She has no known allergies. She has h/o tubal ligation, but denies any other medical history.    Past Medical History  Diagnosis Date  . Hypertension    Past Surgical History  Procedure Laterality Date  . Tubal ligation     Family History  Problem Relation Age of Onset  . Hypertension Mother   . Hypertension Father   . Diabetes Father   . Hypertension Sister   . Heart attack Neg Hx   . Sudden death Neg Hx   . Hyperlipidemia Neg Hx    History  Substance Use Topics  . Smoking status: Never Smoker   . Smokeless tobacco: Not on file  . Alcohol Use: No   OB  History   Grav Para Term Preterm Abortions TAB SAB Ect Mult Living                 Review of Systems  Constitutional: Negative for fever.  Musculoskeletal: Positive for back pain and arthralgias (Right knee pain.).  Neurological: Positive for headaches.  All other systems reviewed and are negative.    Allergies  Review of patient's allergies indicates no known allergies.  Home Medications   Current Outpatient Rx  Name  Route  Sig  Dispense  Refill  . acetaminophen (TYLENOL) 500 MG tablet   Oral   Take 500 mg by mouth every 6 (six) hours as needed. For pain          . metoprolol-hydrochlorothiazide (LOPRESSOR HCT) 50-25 MG per tablet   Oral   Take 1 tablet by mouth daily.            Triage Vitals: BP 139/80  Pulse 93  Temp(Src) 98.5 F (36.9 C) (Oral)  Resp 20  Ht 5\' 6"  (1.676 m)  Wt 227 lb (102.967 kg)  BMI 36.66 kg/m2  SpO2 100%  Physical Exam  Nursing note and vitals reviewed. Constitutional: She is oriented to person, place, and time. She appears well-developed and well-nourished. No distress.  HENT:  Head:  Normocephalic and atraumatic.  Eyes: EOM are normal.  Neck: Neck supple. No tracheal deviation present.  Cardiovascular: Normal rate, regular rhythm and normal heart sounds.   +2 DP pulses.   Pulmonary/Chest: Effort normal. No respiratory distress. She has no wheezes. She has no rales.  Abdominal: Soft. There is no tenderness.  Musculoskeletal: Normal range of motion.  No swelling or tenderness to right calf or foot. Tenderness to medial inferior right knee.   Neurological: She is alert and oriented to person, place, and time.  Normal sensation. Normal strength.   Skin: Skin is warm and dry.  Psychiatric: She has a normal mood and affect. Her behavior is normal.    ED Course  Procedures (including critical care time)  DIAGNOSTIC STUDIES: Oxygen Saturation is 100% on room air, normal by my interpretation.    COORDINATION OF CARE: 7:32  PM-Discussed treatment plan which includes a right knee x-ray and ibuprofen with pt at bedside and pt agreed to plan.     Labs Review Labs Reviewed - No data to display Imaging Review Dg Knee Complete 4 Views Right  07/17/2013   CLINICAL DATA:  Knee pain  EXAM: RIGHT KNEE - COMPLETE 4+ VIEW  COMPARISON:  None.  FINDINGS: There is no evidence of fracture, dislocation, or joint effusion. There is minimal tibial spine osteophytosis. Soft tissues are unremarkable.  IMPRESSION: No acute fracture or dislocation.   Electronically Signed   By: Sherian Rein   On: 07/17/2013 19:59    MDM   1. Right knee pain    Patient's knee x-ray shows no fractures or joint effusion. There is no obvious effusion on exam either. No warmth or erythema to suggest infection. The location of patient's pain does raise the possibility of pes anserine bursitis. We'll treat symptomatically with NSAIDs, ice, rest, and short course of narcotics for breakthrough pain. Discussed her to return precautions including fever, erythema, swelling, or any other concerning symptoms. Will have her followup with PCP and/or orthopedics.    I personally performed the services described in this documentation, which was scribed in my presence. The recorded information has been reviewed and is accurate.    Audree Camel, MD 07/17/13 2152

## 2013-07-17 NOTE — ED Notes (Signed)
Pain in right knee since last week with no known injury.

## 2014-07-24 ENCOUNTER — Encounter (HOSPITAL_BASED_OUTPATIENT_CLINIC_OR_DEPARTMENT_OTHER): Payer: Self-pay | Admitting: Emergency Medicine

## 2014-07-24 ENCOUNTER — Emergency Department (HOSPITAL_BASED_OUTPATIENT_CLINIC_OR_DEPARTMENT_OTHER)
Admission: EM | Admit: 2014-07-24 | Discharge: 2014-07-24 | Disposition: A | Discharge status: Home / Self Care | Payer: PRIVATE HEALTH INSURANCE | Attending: Emergency Medicine | Admitting: Emergency Medicine

## 2014-07-24 DIAGNOSIS — Y929 Unspecified place or not applicable: Secondary | ICD-10-CM | POA: Insufficient documentation

## 2014-07-24 DIAGNOSIS — Y939 Activity, unspecified: Secondary | ICD-10-CM | POA: Diagnosis not present

## 2014-07-24 DIAGNOSIS — IMO0002 Reserved for concepts with insufficient information to code with codable children: Secondary | ICD-10-CM | POA: Insufficient documentation

## 2014-07-24 DIAGNOSIS — T148XXA Other injury of unspecified body region, initial encounter: Secondary | ICD-10-CM

## 2014-07-24 DIAGNOSIS — R296 Repeated falls: Secondary | ICD-10-CM | POA: Diagnosis not present

## 2014-07-24 DIAGNOSIS — I10 Essential (primary) hypertension: Secondary | ICD-10-CM | POA: Insufficient documentation

## 2014-07-24 DIAGNOSIS — Z79899 Other long term (current) drug therapy: Secondary | ICD-10-CM | POA: Diagnosis not present

## 2014-07-24 DIAGNOSIS — S20229A Contusion of unspecified back wall of thorax, initial encounter: Secondary | ICD-10-CM | POA: Insufficient documentation

## 2014-07-24 MED ORDER — IBUPROFEN 800 MG PO TABS: 800.0000 mg | ORAL_TABLET | Freq: Three times a day (TID) | ORAL | Status: DC

## 2014-07-24 MED ORDER — TRAMADOL HCL 50 MG PO TABS: 50.0000 mg | ORAL_TABLET | Freq: Four times a day (QID) | ORAL | Status: DC | PRN

## 2014-07-24 MED ORDER — TRAMADOL HCL 50 MG PO TABS
50.0000 mg | ORAL_TABLET | Freq: Once | ORAL | Status: AC
  Administered 2014-07-24: 50 mg via ORAL
  Filled 2014-07-24: qty 1

## 2014-07-24 MED ORDER — IBUPROFEN 800 MG PO TABS
800.0000 mg | ORAL_TABLET | Freq: Once | ORAL | Status: AC
  Administered 2014-07-24: 800 mg via ORAL
  Filled 2014-07-24: qty 1

## 2014-07-24 NOTE — ED Notes (Signed)
Pt c/o fall on tailbone x 2 days ago

## 2014-07-24 NOTE — Discharge Instructions (Signed)
Chondral Injury Localized injuries to the surface of joints are known as chondral injuries. In chondral injuries, the articular cartilage sustains an injury. This may or may not involve the separation of the articular cartilage from the bone. Chondral injuries do not involve an injury to the underlying bone. These injuries can occur in any joint, although the knee joint is the most commonly injured, followed by the ankle, elbow, and shoulder. Chondral injuries occur most frequently in adolescent males. Chondral injuries are difficult to treat because cartilage has a limited ability to heal.  SYMPTOMS   Pain, swelling, or pain or tenderness in the affected joint.  Giving way, locking, or catching of the joint.  Feeling of a free-floating piece of cartilage in the joint.  A crackling sound (crepitation) within the joint with motion.  Often, injuries to other structures within the joint (ligament tears or meniscus injury). CAUSES  Direct trauma (impaction, avulsion, or shearing or rotational forces) to the joint.  RISK INCREASES WITH:  Participation in contact sports or sports in which playing on, and falling on, hard surfaces may occur.  Adolescence.  Other knee injury (anterior cruciate ligament [ACL] or meniscus tear).  Poor physical strength and flexibility of the joint. PREVENTION  Wear protective equipment (knee, elbow, or shoulder pads) to soften direct trauma.  Wear appropriate-length footwear (cleats for field sports) for the playing surface.  Warm up and stretch properly before physical activity.  Maintain appropriate physical fitness:  Flexibility, strength, and endurance of muscles around joints.  Cardiovascular fitness. PROGNOSIS  The prognosis of chondral injuries is dependent on the size of the lesion (injury). Small chondral lesions may not cause problems. Large and/or deep chondral lesions are more problematic because cartilage does not have the ability to heal.  Chondral injuries may go on to develop arthritis of the joint. Usually the symptoms resolve with appropriate treatment, which can include removal of or fixing loose pieces of cartilage.  RELATED COMPLICATIONS   Frequent recurrence of symptoms, resulting in chronic pain and swelling.  Arthritis of the affected joint.  Loose bodies that cause locking of affected joint. TREATMENT  Treatment initially consists of taking medication and applying ice to reduce pain and inflammation of the affected joint. For injuries to the joints of the leg (knee or ankle), walking with crutches (still allow weight to be placed on the leg) until you walk without a limp is often recommended. You may be asked to perform range-of-motion, stretching, and strengthening exercises. These exercises may be carried out at home, although you may be given a referral to a therapist. Occasionally it may be recommend that you wear a brace, cast, or crutches (for the knee or ankle) to protect or immobilize the joint. If pain persists despite conservative treatment or if loose fragments are present within the joint, surgery is usually recommended. The surgery is typically performed arthroscopically to remove the loose fragments or to re-attach the fragment (if large enough and not deformed). After immobilization or surgery it is important to stretch and strengthen the weakened joint and surrounding muscles (due to the injury, surgery, or the immobilization). This may be done with or without the assistance of a therapist. MEDICATION   If pain medication is necessary, nonsteroidal anti-inflammatory medications, such as aspirin and ibuprofen, or other minor pain relievers, such as acetaminophen, are often recommended.  Do not take pain medication for 7 days before surgery.  Prescription pain relievers are usually only prescribed after surgery. Use only as directed and only as  much as you need. HEAT AND COLD  Cold treatment (icing) relieves  pain and reduces inflammation. Cold treatment should be applied for 10 to 15 minutes every 2 to 3 hours for inflammation and pain and immediately after any activity that aggravates your symptoms. Use ice packs or an ice massage.  Heat treatment may be used prior to performing the stretching and strengthening activities prescribed by your caregiver, physical therapist, or athletic trainer. Use a heat pack or a warm soak. SEEK MEDICAL CARE IF:   Symptoms worsen or do not improve in 2 weeks despite treatment.  Any of the following occur after surgery:  Signs of infection: fever, increased pain, swelling, redness, drainage, or bleeding in the surgical area.  Pain, numbness, or coldness in the foot or hand.  Blue, gray, or dark color appears in the toenails or fingernails.  New, unexplained symptoms develop (this may be due to side affects of the drugs used in treatment). Document Released: 10/11/2005 Document Revised: 02/25/2014 Document Reviewed: 01/23/2009 [facility] Patient Information 2015 [place], [place]. This information is not intended to replace advice given to you by your health care provider. Make sure you discuss any questions you have with your health care provider.

## 2014-07-24 NOTE — ED Provider Notes (Signed)
CSN: [id number]     Arrival date & time 07/24/14  2256 History  This chart was scribed for Fahmida Jurich Smitty Cords, MD by Gwenyth Ober, ED Scribe. This patient was seen in room MH05/MH05 and the patient's care was started at 11:18 PM.   Chief Complaint  Patient presents with  . Tailbone Pain   Patient is a 48 y.o. female presenting with fall. The history is provided by the patient. No language interpreter was used.  Fall This is a new problem. The current episode started yesterday. The problem occurs constantly. The problem has not changed since onset.Pertinent negatives include no chest pain, no abdominal pain, no headaches and no shortness of breath. Exacerbated by: sitting and standing. Nothing relieves the symptoms. Treatments tried: ice. The treatment provided no relief.   HPI Comments: Julie [name] is a 48 y.o. female who presents to the Emergency Department complaining of right buttock pain and associated bruising on her buttocks after falling on a wooden step yesterday morning. Pt has treated with Aleve and ice with little relief to symptoms. Pt denies use of Aspirin, Plavix, Coumadin, Tylenol and Ibuprofen. No head injury no associated bruising   Past Medical History  Diagnosis Date  . Hypertension    Past Surgical History  Procedure Laterality Date  . Tubal ligation     Family History  Problem Relation Age of Onset  . Hypertension Mother   . Hypertension Father   . Diabetes Father   . Hypertension Sister   . Heart attack Neg Hx   . Sudden death Neg Hx   . Hyperlipidemia Neg Hx    History  Substance Use Topics  . Smoking status: Never Smoker   . Smokeless tobacco: Not on file  . Alcohol Use: No   OB History   Grav Para Term Preterm Abortions TAB SAB Ect Mult Living                 Review of Systems  Respiratory: Negative for shortness of breath.   Cardiovascular: Negative for chest pain.  Gastrointestinal: Negative for abdominal pain.  Musculoskeletal:  Positive for arthralgias.  Skin: Positive for color change (bruising right buttocks).  Neurological: Negative for headaches.  All other systems reviewed and are negative.   Allergies  Review of patient's allergies indicates no known allergies.  Home Medications   Prior to Admission medications   Medication Sig Start Date End Date Taking? Authorizing Provider  acetaminophen (TYLENOL) 500 MG tablet Take 500 mg by mouth every 6 (six) hours as needed. For pain     Historical Provider, MD  HYDROcodone-acetaminophen (NORCO) 5-325 MG per tablet Take 1 tablet by mouth every 4 (four) hours as needed for pain. 07/17/13   Audree Camel, MD  ibuprofen (ADVIL,MOTRIN) 800 MG tablet Take 1 tablet (800 mg total) by mouth 3 (three) times daily. 07/17/13   Audree Camel, MD  metoprolol-hydrochlorothiazide (LOPRESSOR HCT) 50-25 MG per tablet Take 1 tablet by mouth daily.      Historical Provider, MD   Pulse 75  Temp(Src) 98.7 F (37.1 C)  Resp 16  Ht 5\' 4"  (1.626 m)  Wt 220 lb (99.791 kg)  BMI 37.74 kg/m2  SpO2 100% Physical Exam  Nursing note and vitals reviewed. Constitutional: She is oriented to person, place, and time. She appears well-developed and well-nourished. No distress.  HENT:  Head: Normocephalic and atraumatic.  Mouth/Throat: Oropharynx is clear and moist. No oropharyngeal exudate.  No bleeding of the gums  Eyes: Pupils  are equal, round, and reactive to light.  Neck: Normal range of motion. Neck supple.  No midline tenderness crepitance or step offs  Cardiovascular: Normal rate and intact distal pulses.   Pulmonary/Chest: Effort normal and breath sounds normal. She has no wheezes. She has no rales.  Abdominal: Soft. Bowel sounds are normal. There is no tenderness. There is no rebound and no guarding.  Pelvis is stable  Musculoskeletal: Normal range of motion. She exhibits no edema.  Lymphadenopathy:    She has no cervical adenopathy.  Neurological: She is alert and oriented  to person, place, and time. No cranial nerve deficit.  Skin: Skin is warm and dry. No rash noted.     3 cm x 6 cm bruise on right buttock  Psychiatric: She has a normal mood and affect. Her behavior is normal.    ED Course  Procedures (including critical care time) DIAGNOSTIC STUDIES: Oxygen Saturation is 100% on RA, normal by my interpretation.    COORDINATION OF CARE: 11:23 PM Discussed treatment plan with pt at bedside and pt agreed to plan.  Labs Review Labs Reviewed - No data to display  Imaging Review No results found.   EKG Interpretation None      MDM   Final diagnoses:  None    Ice for 20 minutes every 2 hours.  Have advised donut for sitting and stool softeners to prevent pain and expansion with straining.  Return for fevers warmth or any concerns.  No tailbone pain or anomalies patient refusing xray.    I personally performed the services described in this documentation, which was scribed in my presence. The recorded information has been reviewed and is accurate.     Jasmine AweApril K Effa Yarrow-Rasch, MD 07/25/14 [phone]

## 2014-07-25 ENCOUNTER — Encounter (HOSPITAL_BASED_OUTPATIENT_CLINIC_OR_DEPARTMENT_OTHER): Payer: Self-pay | Admitting: Emergency Medicine

## 2014-11-27 ENCOUNTER — Emergency Department (HOSPITAL_BASED_OUTPATIENT_CLINIC_OR_DEPARTMENT_OTHER): Payer: 59

## 2014-11-27 ENCOUNTER — Emergency Department (HOSPITAL_BASED_OUTPATIENT_CLINIC_OR_DEPARTMENT_OTHER)
Admission: EM | Admit: 2014-11-27 | Discharge: 2014-11-27 | Disposition: A | Discharge status: Home / Self Care | Payer: 59 | Attending: Emergency Medicine | Admitting: Emergency Medicine

## 2014-11-27 ENCOUNTER — Encounter (HOSPITAL_BASED_OUTPATIENT_CLINIC_OR_DEPARTMENT_OTHER): Payer: Self-pay | Admitting: Emergency Medicine

## 2014-11-27 DIAGNOSIS — I10 Essential (primary) hypertension: Secondary | ICD-10-CM | POA: Insufficient documentation

## 2014-11-27 DIAGNOSIS — M1712 Unilateral primary osteoarthritis, left knee: Secondary | ICD-10-CM | POA: Insufficient documentation

## 2014-11-27 DIAGNOSIS — Z79899 Other long term (current) drug therapy: Secondary | ICD-10-CM | POA: Insufficient documentation

## 2014-11-27 DIAGNOSIS — M25562 Pain in left knee: Secondary | ICD-10-CM | POA: Diagnosis present

## 2014-11-27 LAB — D-DIMER, QUANTITATIVE (NOT AT ARMC): D-Dimer, Quant: <0.27 ug/mL-FEU (ref 0.00–0.48)

## 2014-11-27 MED ORDER — HYDROCODONE-ACETAMINOPHEN 5-325 MG PO TABS: 1.0000 | ORAL_TABLET | Freq: Four times a day (QID) | ORAL | Status: DC | PRN

## 2014-11-27 MED ORDER — HYDROCODONE-ACETAMINOPHEN 5-325 MG PO TABS: 1.0000 | ORAL_TABLET | ORAL | Status: DC | PRN

## 2014-11-27 NOTE — ED Notes (Addendum)
Patient states that she felt a knot to her left knee and leg last weak and today she is starting to have pain to her left knee and leg. The patient walks with a limp.

## 2014-11-27 NOTE — ED Notes (Signed)
Pt states had knot on left knee cap and now has swelling to both sides of knee  Denies inj

## 2014-11-27 NOTE — ED Provider Notes (Signed)
CSN: [id number]     Arrival date & time 11/27/14  0444 History   First MD Initiated Contact with Patient 11/27/14 0454     Chief Complaint  Patient presents with  . Leg Pain     (Consider location/radiation/quality/duration/timing/severity/associated sxs/prior Treatment) HPI Complains of left knee pain onset 2 weeks ago gradually. Pain is worse with weightbearing or walking. She is treated herself with over-the-counter medication containing aspirin, acetaminophen and caffeine, without relief. She reports "a knot" on her anterior knee 2 weeks ago which has since resolved. Pain is diffuse worse at medial knee she also has posterior knee pain .she denies trauma, no fever. denies other associated symptoms. Pain improved with remaining still Past Medical History  Diagnosis Date  . Hypertension    Past Surgical History  Procedure Laterality Date  . Tubal ligation     Family History  Problem Relation Age of Onset  . Hypertension Mother   . Hypertension Father   . Diabetes Father   . Hypertension Sister   . Heart attack Neg Hx   . Sudden death Neg Hx   . Hyperlipidemia Neg Hx    History  Substance Use Topics  . Smoking status: Never Smoker   . Smokeless tobacco: Not on file  . Alcohol Use: No   OB History    No data available     Review of Systems  Constitutional: Negative.   HENT: Negative.   Respiratory: Negative.   Cardiovascular: Negative.   Gastrointestinal: Negative.   Musculoskeletal: Positive for arthralgias.  Skin: Negative.   Neurological: Negative.   Psychiatric/Behavioral: Negative.   All other systems reviewed and are negative.     Allergies  Review of patient's allergies indicates no known allergies.  Home Medications   Prior to Admission medications   Medication Sig Start Date End Date Taking? Authorizing Provider  acetaminophen (TYLENOL) 500 MG tablet Take 500 mg by mouth every 6 (six) hours as needed. For pain     Historical Provider, MD   HYDROcodone-acetaminophen (NORCO) 5-325 MG per tablet Take 1 tablet by mouth every 4 (four) hours as needed for pain. 07/17/13   Audree CamelScott T Goldston, MD  ibuprofen (ADVIL,MOTRIN) 800 MG tablet Take 1 tablet (800 mg total) by mouth 3 (three) times daily. 07/17/13   Audree CamelScott T Goldston, MD  ibuprofen (ADVIL,MOTRIN) 800 MG tablet Take 1 tablet (800 mg total) by mouth 3 (three) times daily. 07/24/14   April K Palumbo-Rasch, MD  metoprolol-hydrochlorothiazide (LOPRESSOR HCT) 50-25 MG per tablet Take 1 tablet by mouth daily.      Historical Provider, MD  traMADol (ULTRAM) 50 MG tablet Take 1 tablet (50 mg total) by mouth every 6 (six) hours as needed. 07/24/14   April K Palumbo-Rasch, MD   BP 146/80 mmHg  Pulse 73  Temp(Src) 97.7 F (36.5 C) (Oral)  Resp 16  Wt 220 lb (99.791 kg)  SpO2 100% Physical Exam  Constitutional: She appears well-developed and well-nourished. No distress.  HENT:  Head: Normocephalic and atraumatic.  Eyes: EOM are normal.  Neck: Neck supple. No tracheal deviation present.  Cardiovascular: Normal rate.   Pulmonary/Chest: Effort normal.  Abdominal:  Obese  Musculoskeletal: She exhibits tenderness. She exhibits no edema.  Left lower extremity no swelling no redness no deformity tender diffusely at knee worse at medial aspect. DP pulse 2+ good capillary refill. All other extremities without redness or tenderness neurovascularly intact  Neurological: She is alert. Coordination normal.  Skin: Skin is warm and dry. No rash noted.  Psychiatric: She has a normal mood and affect.  Nursing note and vitals reviewed.   ED Course  Procedures (including critical care time) Labs Review Labs Reviewed - No data to display  Imaging Review No results found.   EKG Interpretation None     X-ray viewed by me Results for orders placed or performed during the hospital encounter of 11/27/14  D-dimer, quantitative  Result Value Ref Range   D-Dimer, Quant <0.27 0.00 - 0.48 ug/mL-FEU    Dg Knee 2 Views Left  11/27/2014   CLINICAL DATA:  Left knee pain for 2 weeks.  EXAM: LEFT KNEE - 1-2 VIEW  COMPARISON:  None.  FINDINGS: No fracture or dislocation. The alignment and joint spaces are maintained. There is patellofemoral osteoarthritis with osteophytes and subchondral change. Tiny peripheral osteophytes in the medial and lateral tibial femoral compartments. No focal osseous lesion. No large joint effusion or localizing soft tissue abnormality.  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 No acute bony abnormality.   Electronically Signed   By: Rubye Oaks M.D.   On: 11/27/2014 06:27    MDM  Pretest clinical suspicion of DVT is low however patient has posterior tenderness. D dimer ordered.Well's score for dvt= negative  2. Final diagnoses:  None   opioid pain medicine not given here as patient is driving Plan prescription Norco she can follow up with Dr. Primitivo Gauze her primary care physician. Knee pain likely secondary to osteoarthritis. I suggest the patient weight loss, Diagnosis osteoarthritis of left knee    Doug Sou, MD 11/27/14 [phone]

## 2014-11-27 NOTE — Discharge Instructions (Signed)
Arthritis, Nonspecific As Dr. Primitivo GauzeFletcher to help you with a weight loss program so that your knees will not deteriorate as quickly. Weight loss will prevent further damage to your knees. Take Tylenol for mild pain and the pain medicine prescribed for bad pain. Call Dr. Primitivo GauzeFletcher if you need further prescription refills for pain medicine. Arthritis is pain, redness, warmth, or puffiness (inflammation) of a joint. The joint may be stiff or hurt when you move it. One or more joints may be affected. There are many types of arthritis. Your doctor may not know what type you have right away. The most common cause of arthritis is wear and tear on the joint (osteoarthritis). HOME CARE   Only take medicine as told by your doctor.  Rest the joint as much as possible.  Raise (elevate) your joint if it is puffy.    Drink enough fluids to keep your pee (urine) clear or pale yellow.  Follow your doctor's diet instructions.  Use cold packs for very bad joint pain for 10 to 15 minutes every hour. Ask your doctor if it is okay for you to use hot packs.  Exercise as told by your doctor.  Take a warm shower if you have stiffness in the morning.  Move your sore joints throughout the day. GET HELP RIGHT AWAY IF:   You have a fever.  You have very bad joint pain, puffiness, or redness.  You have many joints that are painful and puffy.  You are not getting better with treatment.  You have very bad back pain or leg weakness.  You cannot control when you poop (bowel movement) or pee (urinate).  You do not feel better in 24 hours or are getting worse.  You are having side effects from your medicine. MAKE SURE YOU:   Understand these instructions.  Will watch your condition.  Will get help right away if you are not doing well or get worse. Document Released: 01/05/2010 Document Revised: 04/11/2012 Document Reviewed: 01/05/2010 [facility] Patient Information 2015 [place], [place]. This information is  not intended to replace advice given to you by your health care provider. Make sure you discuss any questions you have with your health care provider.

## 2015-06-21 ENCOUNTER — Emergency Department (HOSPITAL_BASED_OUTPATIENT_CLINIC_OR_DEPARTMENT_OTHER): Payer: 59

## 2015-06-21 ENCOUNTER — Encounter (HOSPITAL_BASED_OUTPATIENT_CLINIC_OR_DEPARTMENT_OTHER): Payer: Self-pay | Admitting: *Deleted

## 2015-06-21 ENCOUNTER — Emergency Department (HOSPITAL_BASED_OUTPATIENT_CLINIC_OR_DEPARTMENT_OTHER)
Admission: EM | Admit: 2015-06-21 | Discharge: 2015-06-21 | Disposition: A | Discharge status: Home / Self Care | Payer: 59 | Attending: Emergency Medicine | Admitting: Emergency Medicine

## 2015-06-21 DIAGNOSIS — Z79899 Other long term (current) drug therapy: Secondary | ICD-10-CM | POA: Diagnosis not present

## 2015-06-21 DIAGNOSIS — H8110 Benign paroxysmal vertigo, unspecified ear: Secondary | ICD-10-CM | POA: Diagnosis not present

## 2015-06-21 DIAGNOSIS — I10 Essential (primary) hypertension: Secondary | ICD-10-CM | POA: Diagnosis not present

## 2015-06-21 DIAGNOSIS — R42 Dizziness and giddiness: Secondary | ICD-10-CM | POA: Diagnosis present

## 2015-06-21 LAB — CBC WITH DIFFERENTIAL/PLATELET
Basophils Absolute: 0 10*3/uL (ref 0.0–0.1)
Basophils Relative: 0 % (ref 0–1)
EOS PCT: 2 % (ref 0–5)
Eosinophils Absolute: 0.2 10*3/uL (ref 0.0–0.7)
HEMATOCRIT: 37.6 % (ref 36.0–46.0)
Hemoglobin: 12.7 g/dL (ref 12.0–15.0)
LYMPHS ABS: 2.7 10*3/uL (ref 0.7–4.0)
LYMPHS PCT: 25 % (ref 12–46)
MCH: 26.7 pg (ref 26.0–34.0)
MCHC: 33.8 g/dL (ref 30.0–36.0)
MCV: 79.2 fL (ref 78.0–100.0)
MONO ABS: 0.9 10*3/uL (ref 0.1–1.0)
Monocytes Relative: 8 % (ref 3–12)
NEUTROS ABS: 7 10*3/uL (ref 1.7–7.7)
Neutrophils Relative %: 65 % (ref 43–77)
PLATELETS: 272 10*3/uL (ref 150–400)
RBC: 4.75 MIL/uL (ref 3.87–5.11)
RDW: 11.9 % (ref 11.5–15.5)
WBC: 10.8 10*3/uL — ABNORMAL HIGH (ref 4.0–10.5)

## 2015-06-21 LAB — BASIC METABOLIC PANEL
ANION GAP: 10 (ref 5–15)
BUN: 16 mg/dL (ref 6–20)
CO2: 28 mmol/L (ref 22–32)
Calcium: 9.1 mg/dL (ref 8.9–10.3)
Chloride: 100 mmol/L — ABNORMAL LOW (ref 101–111)
Creatinine, Ser: 1.04 mg/dL — ABNORMAL HIGH (ref 0.44–1.00)
GFR calc Af Amer: >60 mL/min (ref >60)
GFR calc non Af Amer: >60 mL/min (ref >60)
GLUCOSE: 85 mg/dL (ref 65–99)
POTASSIUM: 3.8 mmol/L (ref 3.5–5.1)
Sodium: 138 mmol/L (ref 135–145)

## 2015-06-21 LAB — CBG MONITORING, ED: Glucose-Capillary: 66 mg/dL (ref 65–99)

## 2015-06-21 MED ORDER — MECLIZINE HCL 50 MG PO TABS: 50.0000 mg | ORAL_TABLET | Freq: Three times a day (TID) | ORAL | Status: DC | PRN

## 2015-06-21 NOTE — Discharge Instructions (Signed)
Benign Positional Vertigo Vertigo means you feel like you or your surroundings are moving when they are not. Benign positional vertigo is the most common form of vertigo. Benign means that the cause of your condition is not serious. Benign positional vertigo is more common in older adults. CAUSES  Benign positional vertigo is the result of an upset in the labyrinth system. This is an area in the middle ear that helps control your balance. This may be caused by a viral infection, head injury, or repetitive motion. However, often no specific cause is found. SYMPTOMS  Symptoms of benign positional vertigo occur when you move your head or eyes in different directions. Some of the symptoms may include:  Loss of balance and falls.  Vomiting.  Blurred vision.  Dizziness.  Nausea.  Involuntary eye movements (nystagmus). DIAGNOSIS  Benign positional vertigo is usually diagnosed by physical exam. If the specific cause of your benign positional vertigo is unknown, your caregiver may perform imaging tests, such as magnetic resonance imaging (MRI) or computed tomography (CT). TREATMENT  Your caregiver may recommend movements or procedures to correct the benign positional vertigo. Medicines such as meclizine, benzodiazepines, and medicines for nausea may be used to treat your symptoms. In rare cases, if your symptoms are caused by certain conditions that affect the inner ear, you may need surgery. HOME CARE INSTRUCTIONS   Follow your caregiver's instructions.  Move slowly. Do not make sudden body or head movements.  Avoid driving.  Avoid operating heavy machinery.  Avoid performing any tasks that would be dangerous to you or others during a vertigo episode.  Drink enough fluids to keep your urine clear or pale yellow. SEEK IMMEDIATE MEDICAL CARE IF:   You develop problems with walking, weakness, numbness, or using your arms, hands, or legs.  You have difficulty speaking.  You develop  severe headaches.  Your nausea or vomiting continues or gets worse.  You develop visual changes.  Your family or friends notice any behavioral changes.  Your condition gets worse.  You have a fever.  You develop a stiff neck or sensitivity to light. MAKE SURE YOU:   Understand these instructions.  Will watch your condition.  Will get help right away if you are not doing well or get worse. Document Released: 07/19/2006 Document Revised: 01/03/2012 Document Reviewed: 07/01/2011 ExitCare Patient Information 2015 [employer]. This information is not intended to replace advice given to you by your health care provider. Make sure you discuss any questions you have with your health care provider.    

## 2015-06-21 NOTE — ED Notes (Signed)
On cont cardiac monitor

## 2015-06-21 NOTE — ED Notes (Signed)
FAST: negative 

## 2015-06-21 NOTE — ED Provider Notes (Signed)
CSN: [id number]     Arrival date & time 06/21/15  1059 History   First MD Initiated Contact with Patient 06/21/15 1206     Chief Complaint  Patient presents with  . Dizziness      HPI Patient had episode of vertigo this morning she was getting out of bed.  Gone now.  Had no lateralizing weakness.  Has had a slight headache prior to today.  No neck stiffness.  Gradual onset. Past Medical History  Diagnosis Date  . Hypertension    Past Surgical History  Procedure Laterality Date  . Tubal ligation    . Endometrial ablation     Family History  Problem Relation Age of Onset  . Hypertension Mother   . Hypertension Father   . Diabetes Father   . Hypertension Sister   . Heart attack Neg Hx   . Sudden death Neg Hx   . Hyperlipidemia Neg Hx    Social History  Substance Use Topics  . Smoking status: Never Smoker   . Smokeless tobacco: Never Used  . Alcohol Use: No   OB History    No data available     Review of Systems All other systems reviewed and are negative   Allergies  Review of patient's allergies indicates no known allergies.  Home Medications   Prior to Admission medications   Medication Sig Start Date End Date Taking? Authorizing Provider  ibuprofen (ADVIL,MOTRIN) 800 MG tablet Take 1 tablet (800 mg total) by mouth 3 (three) times daily. 07/17/13  Yes Pricilla Loveless, MD  metoprolol-hydrochlorothiazide (LOPRESSOR HCT) 50-25 MG per tablet Take 1 tablet by mouth daily.     Yes Historical Provider, MD  acetaminophen (TYLENOL) 500 MG tablet Take 500 mg by mouth every 6 (six) hours as needed. For pain     Historical Provider, MD  HYDROcodone-acetaminophen (NORCO) 5-325 MG per tablet Take 1 tablet by mouth every 4 (four) hours as needed. 11/27/14   Doug Sou, MD  HYDROcodone-acetaminophen (NORCO) 5-325 MG per tablet Take 1-2 tablets by mouth every 6 (six) hours as needed for moderate pain or severe pain. 11/27/14   Doug Sou, MD  ibuprofen (ADVIL,MOTRIN) 800 MG  tablet Take 1 tablet (800 mg total) by mouth 3 (three) times daily. 07/24/14   April Palumbo, MD  meclizine (ANTIVERT) 50 MG tablet Take 1 tablet (50 mg total) by mouth 3 (three) times daily as needed. 06/21/15   Nelva Nay, MD  traMADol (ULTRAM) 50 MG tablet Take 1 tablet (50 mg total) by mouth every 6 (six) hours as needed. 07/24/14   April Palumbo, MD   BP 134/85 mmHg  Pulse 58  Temp(Src) 98.2 F (36.8 C) (Oral)  Resp 14  Ht  (1.626 m)  Wt 226 lb (102.513 kg)  BMI 38.77 kg/m2  SpO2 100% Physical Exam  Constitutional: She is oriented to person, place, and time. She appears well-developed and well-nourished. No distress.  HENT:  Head: Normocephalic and atraumatic.  Eyes: Pupils are equal, round, and reactive to light.  Neck: Normal range of motion.  Cardiovascular: Normal rate and intact distal pulses.   Pulmonary/Chest: No respiratory distress.  Abdominal: Normal appearance. She exhibits no distension.  Musculoskeletal: Normal range of motion.  Neurological: She is alert and oriented to person, place, and time. She has normal strength. No cranial nerve deficit or sensory deficit. She displays a negative Romberg sign. Gait normal. GCS eye subscore is 4. GCS verbal subscore is 5. GCS motor subscore is 6.  Skin: Skin is warm and dry. No rash noted.  Psychiatric: She has a normal mood and affect. Her behavior is normal.  Nursing note and vitals reviewed.   ED Course  Procedures (including critical care time) Labs Review Labs Reviewed  BASIC METABOLIC PANEL - Abnormal; Notable for the following:    Chloride 100 (*)    Creatinine, Ser 1.04 (*)    All other components within normal limits  CBC WITH DIFFERENTIAL/PLATELET - Abnormal; Notable for the following:    WBC 10.8 (*)    All other components within normal limits  CBG MONITORING, ED    Imaging Review Ct Head Wo Contrast  06/21/2015   CLINICAL DATA:  Dizziness. Unsteady gait and headache. Hypertension.  EXAM: CT HEAD  WITHOUT CONTRAST  TECHNIQUE: Contiguous axial images were obtained from the base of the skull through the vertex without intravenous contrast.  COMPARISON:  11/21/2011  FINDINGS: Sinuses/Soft tissues: Clear paranasal sinuses and mastoid air cells.  Intracranial: No mass lesion, hemorrhage, hydrocephalus, acute infarct, intra-axial, or extra-axial fluid collection.  IMPRESSION: Normal head CT.   Electronically Signed   By: Jeronimo Greaves M.D.   On: 06/21/2015 12:37   I have personally reviewed and evaluated these images and lab results as part of my medical decision-making.   EKG Interpretation   Date/Time:  Saturday June 21 2015 11:32:41 EDT Ventricular Rate:  61 PR Interval:  154 QRS Duration: 80 QT Interval:  426 QTC Calculation: 428 R Axis:   12 Text Interpretation:  Normal sinus rhythm Normal ECG Confirmed by Nyelah Emmerich   MD, Laniah Grimm ([id number]) on 06/21/2015 11:57:15 AM      MDM   Final diagnoses:  Benign positional vertigo, unspecified laterality        Nelva Nay, MD 06/21/15 1356

## 2015-06-21 NOTE — ED Notes (Addendum)
Pt reports dizziness and blurred vision this am- states speech was altered this morning- sx mostly resolved- states has been having sinus/allergy sx and headache earlier this week- grips equal, facial symmetry present, speech clear

## 2015-06-21 NOTE — ED Notes (Signed)
Pt presents with dizziness, states this is a frequent incident.

## 2015-08-03 ENCOUNTER — Encounter (HOSPITAL_BASED_OUTPATIENT_CLINIC_OR_DEPARTMENT_OTHER): Payer: Self-pay | Admitting: Emergency Medicine

## 2015-08-03 ENCOUNTER — Emergency Department (HOSPITAL_BASED_OUTPATIENT_CLINIC_OR_DEPARTMENT_OTHER)
Admission: EM | Admit: 2015-08-03 | Discharge: 2015-08-03 | Disposition: A | Discharge status: Home / Self Care | Payer: 59 | Attending: Emergency Medicine | Admitting: Emergency Medicine

## 2015-08-03 DIAGNOSIS — Z79899 Other long term (current) drug therapy: Secondary | ICD-10-CM | POA: Insufficient documentation

## 2015-08-03 DIAGNOSIS — E669 Obesity, unspecified: Secondary | ICD-10-CM | POA: Insufficient documentation

## 2015-08-03 DIAGNOSIS — I10 Essential (primary) hypertension: Secondary | ICD-10-CM | POA: Diagnosis not present

## 2015-08-03 DIAGNOSIS — R05 Cough: Secondary | ICD-10-CM | POA: Insufficient documentation

## 2015-08-03 DIAGNOSIS — R52 Pain, unspecified: Secondary | ICD-10-CM | POA: Insufficient documentation

## 2015-08-03 DIAGNOSIS — Z791 Long term (current) use of non-steroidal anti-inflammatories (NSAID): Secondary | ICD-10-CM | POA: Diagnosis not present

## 2015-08-03 DIAGNOSIS — R0981 Nasal congestion: Secondary | ICD-10-CM | POA: Diagnosis not present

## 2015-08-03 MED ORDER — PSEUDOEPHEDRINE HCL 60 MG PO TABS: 60.0000 mg | ORAL_TABLET | Freq: Three times a day (TID) | ORAL | Status: DC | PRN

## 2015-08-03 NOTE — Discharge Instructions (Signed)
You were evaluated in the ED today and did not appear to be an emergent cause for your symptoms at this time. Please take your medications as prescribed. Her symptoms are likely due to a virus and will resolve on their own. This medication will help with your nasal congestion. Follow-up with your doctor in 1 week for reevaluation. Return to ED for worsening symptoms.

## 2015-08-03 NOTE — ED Notes (Addendum)
Presents with coughing, son has been sick also with flu, coughing began this past Thursday, tried OTC medications w/o relief. Having fatigue with body aches, states appetite remains good

## 2015-08-03 NOTE — ED Provider Notes (Signed)
CSN: [id number]     Arrival date & time 08/03/15  1149 History   First MD Initiated Contact with Patient 08/03/15 1216     Chief Complaint  Patient presents with  . URI     (Consider location/radiation/quality/duration/timing/severity/associated sxs/prior Treatment) HPI Julie [name] is a 49 y.o. female with history of hypertension comes in for evaluation of URI. Patient states since Thursday she has had progressively worsening nasal congestion, dry cough, body aches. She reports trying NyQuil with some relief of her symptoms. Denies any fevers at home. Reports her son was recently sick with the flu. Denies any leg swelling, hemoptysis, neck stiffness or pain, headaches or vision changes, rash. No other aggravating or modifying factors.  Past Medical History  Diagnosis Date  . Hypertension    Past Surgical History  Procedure Laterality Date  . Tubal ligation    . Endometrial ablation     Family History  Problem Relation Age of Onset  . Hypertension Mother   . Hypertension Father   . Diabetes Father   . Hypertension Sister   . Heart attack Neg Hx   . Sudden death Neg Hx   . Hyperlipidemia Neg Hx    Social History  Substance Use Topics  . Smoking status: Never Smoker   . Smokeless tobacco: Never Used  . Alcohol Use: No   OB History    No data available     Review of Systems A 10 point review of systems was completed and was negative except for pertinent positives and negatives as mentioned in the history of present illness     Allergies  Review of patient's allergies indicates no known allergies.  Home Medications   Prior to Admission medications   Medication Sig Start Date End Date Taking? Authorizing Provider  acetaminophen (TYLENOL) 500 MG tablet Take 500 mg by mouth every 6 (six) hours as needed. For pain    Yes Historical Provider, MD  metoprolol-hydrochlorothiazide (LOPRESSOR HCT) 50-25 MG per tablet Take 1 tablet by mouth daily.     Yes Historical Provider,  MD  potassium chloride (K-DUR) 10 MEQ tablet Take 10 mEq by mouth daily.   Yes Historical Provider, MD  HYDROcodone-acetaminophen (NORCO) 5-325 MG per tablet Take 1 tablet by mouth every 4 (four) hours as needed. 11/27/14   Doug Sou, MD  HYDROcodone-acetaminophen (NORCO) 5-325 MG per tablet Take 1-2 tablets by mouth every 6 (six) hours as needed for moderate pain or severe pain. 11/27/14   Doug Sou, MD  ibuprofen (ADVIL,MOTRIN) 800 MG tablet Take 1 tablet (800 mg total) by mouth 3 (three) times daily. 07/17/13   Pricilla Loveless, MD  ibuprofen (ADVIL,MOTRIN) 800 MG tablet Take 1 tablet (800 mg total) by mouth 3 (three) times daily. 07/24/14   April Palumbo, MD  meclizine (ANTIVERT) 50 MG tablet Take 1 tablet (50 mg total) by mouth 3 (three) times daily as needed. 06/21/15   Nelva Nay, MD  pseudoephedrine (SUDAFED) 60 MG tablet Take 1 tablet (60 mg total) by mouth every 8 (eight) hours as needed for congestion. 08/03/15   Joycie Peek, PA-C  traMADol (ULTRAM) 50 MG tablet Take 1 tablet (50 mg total) by mouth every 6 (six) hours as needed. 07/24/14   April Palumbo, MD   BP 132/93 mmHg  Pulse 80  Temp(Src) 98.3 F (36.8 C) (Oral)  Resp 20  Ht  (1.626 m)  Wt 220 lb (99.791 kg)  BMI 37.74 kg/m2  SpO2 100% Physical Exam  Constitutional: She is  oriented to person, place, and time. She appears well-developed and well-nourished.  Obese African-American female  HENT:  Head: Normocephalic and atraumatic.  Mouth/Throat: Oropharynx is clear and moist.  Eyes: Conjunctivae are normal. Pupils are equal, round, and reactive to light. Right eye exhibits no discharge. Left eye exhibits no discharge. No scleral icterus.  Neck: Normal range of motion. Neck supple.  No meningismus or nuchal rigidity  Cardiovascular: Normal rate, regular rhythm, normal heart sounds and intact distal pulses.   Pulmonary/Chest: Effort normal and breath sounds normal. No respiratory distress. She has no wheezes. She  has no rales.  Abdominal: Soft. There is no tenderness.  Musculoskeletal: Normal range of motion. She exhibits no edema or tenderness.  Neurological: She is alert and oriented to person, place, and time.  Cranial Nerves II-XII grossly intact  Skin: Skin is warm and dry. No rash noted.  Psychiatric: She has a normal mood and affect.  Nursing note and vitals reviewed.   ED Course  Procedures (including critical care time) Labs Review Labs Reviewed - No data to display  Imaging Review No results found. I have personally reviewed and evaluated these images and lab results as part of my medical decision-making.   EKG Interpretation None     Meds given in ED:  Medications - No data to display  Discharge Medication List as of 08/03/2015 12:40 PM    START taking these medications   Details  pseudoephedrine (SUDAFED) 60 MG tablet Take 1 tablet (60 mg total) by mouth every 8 (eight) hours as needed for congestion., Starting 08/03/2015, Until Discontinued, Print       Filed Vitals:   08/03/15 1202  BP: 132/93  Pulse: 80  Temp: 98.3 F (36.8 C)  TempSrc: Oral  Resp: 20  Height:  (1.626 m)  Weight: 220 lb (99.791 kg)  SpO2: 100%    MDM  Patient presents for evaluation of nasal congestion and body aches. Symptoms mostly consistent with viral process. Symptoms appear to be very mild at this time. Unremarkable physical exam and benign cardiopulmonary exams. Encourage continued use of Tylenol and Motrin as needed for body aches. Will give prescription for Sudafed for congestion. Low suspicion for influenza, meningitis. Doubt PE. Patient is afebrile, hemodynamically stable with normal vital signs. Discussed follow-up with PCP in one week for reevaluation, patient agrees with plan. No evidence of other acute or emergent pathology at this time. Final diagnoses:  Body aches  Nasal congestion        Joycie Peek, PA-C 08/03/15 1624  Lyndal Pulley, MD 08/04/15 [phone]

## 2015-08-03 NOTE — ED Notes (Signed)
Cough, fever, body aches x 1 1/2 weeks

## 2015-09-19 ENCOUNTER — Emergency Department (HOSPITAL_BASED_OUTPATIENT_CLINIC_OR_DEPARTMENT_OTHER)
Admission: EM | Admit: 2015-09-19 | Discharge: 2015-09-19 | Disposition: A | Discharge status: Home / Self Care | Payer: 59 | Attending: Emergency Medicine | Admitting: Emergency Medicine

## 2015-09-19 ENCOUNTER — Encounter (HOSPITAL_BASED_OUTPATIENT_CLINIC_OR_DEPARTMENT_OTHER): Payer: Self-pay | Admitting: Emergency Medicine

## 2015-09-19 DIAGNOSIS — M542 Cervicalgia: Secondary | ICD-10-CM | POA: Diagnosis present

## 2015-09-19 DIAGNOSIS — M62838 Other muscle spasm: Secondary | ICD-10-CM

## 2015-09-19 DIAGNOSIS — Z79899 Other long term (current) drug therapy: Secondary | ICD-10-CM | POA: Insufficient documentation

## 2015-09-19 DIAGNOSIS — I10 Essential (primary) hypertension: Secondary | ICD-10-CM | POA: Diagnosis not present

## 2015-09-19 DIAGNOSIS — Z791 Long term (current) use of non-steroidal anti-inflammatories (NSAID): Secondary | ICD-10-CM | POA: Insufficient documentation

## 2015-09-19 DIAGNOSIS — M62830 Muscle spasm of back: Secondary | ICD-10-CM | POA: Insufficient documentation

## 2015-09-19 MED ORDER — CYCLOBENZAPRINE HCL 10 MG PO TABS: 10.0000 mg | ORAL_TABLET | Freq: Three times a day (TID) | ORAL | Status: DC | PRN

## 2015-09-19 MED ORDER — HYDROCODONE-ACETAMINOPHEN 5-325 MG PO TABS
1.0000 | ORAL_TABLET | Freq: Four times a day (QID) | ORAL | Status: DC | PRN
Start: 2015-09-19 — End: 2019-09-12

## 2015-09-19 NOTE — ED Provider Notes (Signed)
CSN: [id number]     Arrival date & time 09/19/15  0301 History   First MD Initiated Contact with Patient 09/19/15 0400     Chief Complaint  Patient presents with  . Neck Pain     (Consider location/radiation/quality/duration/timing/severity/associated sxs/prior Treatment) HPI  This is a 49 year old female who was doing a lot of lifting at work 2 days ago. Yesterday morning about 11 AM she developed pain in the right posterior lateral neck. She describes the pain as feeling like a spasm and is moderate to severe at its worst. It does not radiate. There is no associated weakness, numbness or tingling of the right arm. The pain is worse with movement of the neck. She has taken over-the-counter analgesics without adequate relief.  Past Medical History  Diagnosis Date  . Hypertension    Past Surgical History  Procedure Laterality Date  . Tubal ligation    . Endometrial ablation     Family History  Problem Relation Age of Onset  . Hypertension Mother   . Hypertension Father   . Diabetes Father   . Hypertension Sister   . Heart attack Neg Hx   . Sudden death Neg Hx   . Hyperlipidemia Neg Hx    Social History  Substance Use Topics  . Smoking status: Never Smoker   . Smokeless tobacco: Never Used  . Alcohol Use: No   OB History    No data available     Review of Systems  All other systems reviewed and are negative.   Allergies  Review of patient's allergies indicates no known allergies.  Home Medications   Prior to Admission medications   Medication Sig Start Date End Date Taking? Authorizing Provider  acetaminophen (TYLENOL) 500 MG tablet Take 500 mg by mouth every 6 (six) hours as needed. For pain     Historical Provider, MD  HYDROcodone-acetaminophen (NORCO) 5-325 MG per tablet Take 1 tablet by mouth every 4 (four) hours as needed. 11/27/14   Doug SouSam Jacubowitz, MD  HYDROcodone-acetaminophen (NORCO) 5-325 MG per tablet Take 1-2 tablets by mouth every 6 (six) hours as  needed for moderate pain or severe pain. 11/27/14   Doug SouSam Jacubowitz, MD  ibuprofen (ADVIL,MOTRIN) 800 MG tablet Take 1 tablet (800 mg total) by mouth 3 (three) times daily. 07/17/13   Pricilla LovelessScott Goldston, MD  ibuprofen (ADVIL,MOTRIN) 800 MG tablet Take 1 tablet (800 mg total) by mouth 3 (three) times daily. 07/24/14   April Palumbo, MD  meclizine (ANTIVERT) 50 MG tablet Take 1 tablet (50 mg total) by mouth 3 (three) times daily as needed. 06/21/15   Nelva Nayobert Beaton, MD  metoprolol-hydrochlorothiazide (LOPRESSOR HCT) 50-25 MG per tablet Take 1 tablet by mouth daily.      Historical Provider, MD  potassium chloride (K-DUR) 10 MEQ tablet Take 10 mEq by mouth daily.    Historical Provider, MD  pseudoephedrine (SUDAFED) 60 MG tablet Take 1 tablet (60 mg total) by mouth every 8 (eight) hours as needed for congestion. 08/03/15   Joycie PeekBenjamin Cartner, PA-C  traMADol (ULTRAM) 50 MG tablet Take 1 tablet (50 mg total) by mouth every 6 (six) hours as needed. 07/24/14   April Palumbo, MD   BP 134/109 mmHg  Pulse 81  Temp(Src) 98.3 F (36.8 C) (Oral)  Resp 18  Ht 5\' 6"  (1.676 m)  Wt 230 lb (104.327 kg)  BMI 37.14 kg/m2  SpO2 100%   Physical Exam  General: Well-developed, well-nourished female in no acute distress; appearance consistent with age of record  HENT: normocephalic; atraumatic Eyes: pupils equal, round and reactive to light; extraocular muscles intact Neck: supple; right posterior lateral soft tissue neck tenderness with muscle spasm Heart: regular rate and rhythm Lungs: clear to auscultation bilaterally Abdomen: soft; nondistended Extremities: No deformity; full range of motion Neurologic: Awake, alert and oriented; motor function intact in all extremities and symmetric; no facial droop Skin: Warm and dry Psychiatric: Normal mood and affect    ED Course  Procedures (including critical care time)   MDM     Paula Libra, MD 09/19/15 3086

## 2015-09-19 NOTE — ED Notes (Signed)
Pt reports pain in right side of neck OTC medication ineffective. 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

## 2015-09-24 ENCOUNTER — Encounter (HOSPITAL_BASED_OUTPATIENT_CLINIC_OR_DEPARTMENT_OTHER): Payer: Self-pay | Admitting: Emergency Medicine

## 2015-09-24 ENCOUNTER — Emergency Department (HOSPITAL_BASED_OUTPATIENT_CLINIC_OR_DEPARTMENT_OTHER): Payer: 59

## 2015-09-24 ENCOUNTER — Emergency Department (HOSPITAL_BASED_OUTPATIENT_CLINIC_OR_DEPARTMENT_OTHER)
Admission: EM | Admit: 2015-09-24 | Discharge: 2015-09-24 | Disposition: A | Discharge status: Home / Self Care | Payer: 59 | Attending: Emergency Medicine | Admitting: Emergency Medicine

## 2015-09-24 DIAGNOSIS — M62838 Other muscle spasm: Secondary | ICD-10-CM | POA: Diagnosis not present

## 2015-09-24 DIAGNOSIS — M542 Cervicalgia: Secondary | ICD-10-CM | POA: Diagnosis present

## 2015-09-24 DIAGNOSIS — Z79899 Other long term (current) drug therapy: Secondary | ICD-10-CM | POA: Insufficient documentation

## 2015-09-24 DIAGNOSIS — I10 Essential (primary) hypertension: Secondary | ICD-10-CM | POA: Insufficient documentation

## 2015-09-24 MED ORDER — DIAZEPAM 5 MG PO TABS
5.0000 mg | ORAL_TABLET | Freq: Three times a day (TID) | ORAL | Status: DC | PRN
Start: 2015-09-24 — End: 2016-03-25

## 2015-09-24 MED ORDER — KETOROLAC TROMETHAMINE 60 MG/2ML IM SOLN
30.0000 mg | Freq: Once | INTRAMUSCULAR | Status: AC
  Administered 2015-09-24: 30 mg via INTRAMUSCULAR
  Filled 2015-09-24: qty 2

## 2015-09-24 MED ORDER — IBUPROFEN 600 MG PO TABS: 600.0000 mg | ORAL_TABLET | Freq: Four times a day (QID) | ORAL | Status: DC | PRN

## 2015-09-24 MED ORDER — DIAZEPAM 5 MG PO TABS
5.0000 mg | ORAL_TABLET | Freq: Once | ORAL | Status: AC
  Administered 2015-09-24: 5 mg via ORAL
  Filled 2015-09-24: qty 1

## 2015-09-24 NOTE — ED Notes (Signed)
Patient transported to X-ray 

## 2015-09-24 NOTE — ED Notes (Signed)
MD at bedside. 

## 2015-09-24 NOTE — ED Provider Notes (Signed)
CSN: [id number]     Arrival date & time 09/24/15  [id number] History   First MD Initiated Contact with Patient 09/24/15 0531     Chief Complaint  Patient presents with  . Neck Pain     (Consider location/radiation/quality/duration/timing/severity/associated sxs/prior Treatment) HPI  This a 49 year old female who presents with neck pain. Patient was seen and evaluated on Friday for the same. She was discharged with Flexeril and hydrocodone. She reports persistent pain and stiffness. She reports minimal relief with pain medication. She does state that she gets some relief with ibuprofen. She states that the pain woke her up this morning. She denies any weakness, numbness, tingling down her arms. She denies any radiation of the pain. Currently her pain is 8 out of 10. She denies any injury but does do heavy lifting.  She denies headache.  Past Medical History  Diagnosis Date  . Hypertension    Past Surgical History  Procedure Laterality Date  . Tubal ligation    . Endometrial ablation     Family History  Problem Relation Age of Onset  . Hypertension Mother   . Hypertension Father   . Diabetes Father   . Hypertension Sister   . Heart attack Neg Hx   . Sudden death Neg Hx   . Hyperlipidemia Neg Hx    Social History  Substance Use Topics  . Smoking status: Never Smoker   . Smokeless tobacco: Never Used  . Alcohol Use: No   OB History    No data available     Review of Systems  Constitutional: Negative for fever.  Respiratory: Negative for chest tightness and shortness of breath.   Cardiovascular: Negative for chest pain.  Gastrointestinal: Negative for nausea and vomiting.  Genitourinary: Negative for dysuria.  Musculoskeletal: Positive for neck pain and neck stiffness. Negative for back pain.  Neurological: Negative for headaches.  All other systems reviewed and are negative.     Allergies  Review of patient's allergies indicates no known allergies.  Home Medications    Prior to Admission medications   Medication Sig Start Date End Date Taking? Authorizing Provider  acetaminophen (TYLENOL) 500 MG tablet Take 500 mg by mouth every 6 (six) hours as needed. For pain     Historical Provider, MD  diazepam (VALIUM) 5 MG tablet Take 1 tablet (5 mg total) by mouth every 8 (eight) hours as needed for muscle spasms. 09/24/15   Shon Batonourtney F Yaritzi Craun, MD  HYDROcodone-acetaminophen (NORCO) 5-325 MG tablet Take 1-2 tablets by mouth every 6 (six) hours as needed (for pain). 09/19/15   John Molpus, MD  ibuprofen (ADVIL,MOTRIN) 600 MG tablet Take 1 tablet (600 mg total) by mouth every 6 (six) hours as needed. Limit use to 5-7 days 09/24/15   Shon Batonourtney F Najiyah Paris, MD  meclizine (ANTIVERT) 50 MG tablet Take 1 tablet (50 mg total) by mouth 3 (three) times daily as needed. 06/21/15   Nelva Nayobert Beaton, MD  metoprolol-hydrochlorothiazide (LOPRESSOR HCT) 50-25 MG per tablet Take 1 tablet by mouth daily.      Historical Provider, MD  potassium chloride (K-DUR) 10 MEQ tablet Take 10 mEq by mouth daily.    Historical Provider, MD  pseudoephedrine (SUDAFED) 60 MG tablet Take 1 tablet (60 mg total) by mouth every 8 (eight) hours as needed for congestion. 08/03/15   Joycie PeekBenjamin Cartner, PA-C   BP 127/74 mmHg  Pulse 74  Temp(Src) 98.2 F (36.8 C) (Oral)  Resp 17  Ht 5\' 5"  (1.651 m)  Wt 240 lb (  108.863 kg)  BMI 39.94 kg/m2  SpO2 100%  LMP  Physical Exam  Constitutional: She is oriented to person, place, and time. She appears well-developed and well-nourished. No distress.  HENT:  Head: Normocephalic and atraumatic.  Mouth/Throat: Oropharynx is clear and moist.  Neck: Normal range of motion. Neck supple.  Tenderness palpation over the right posterior lateral neck, no midline tenderness, step-off, or deformity, normal range of motion  Cardiovascular: Normal rate, regular rhythm and normal heart sounds.   Pulmonary/Chest: Effort normal and breath sounds normal. No respiratory distress. She has no  wheezes.  Abdominal: Soft. There is no tenderness.  Neurological: She is alert and oriented to person, place, and time.  Normal gait, 5 out of 5 strength in all 4 extremities  Skin: Skin is warm and dry.  Psychiatric: She has a normal mood and affect.  Nursing note and vitals reviewed.   ED Course  Procedures (including critical care time) Labs Review Labs Reviewed - No data to display  Imaging Review Dg Cervical Spine Complete  09/24/2015  CLINICAL DATA:  Right-sided neck pain for 7 days. EXAM: CERVICAL SPINE - COMPLETE 4+ VIEW COMPARISON:  None. FINDINGS: There is no fracture or other acute bony abnormality. There is mild straightening of lordosis. No significant arthritic changes are evident. Neural foramina appear patent bilaterally. No bone lesion or bony destruction. IMPRESSION: Mild straightening, which can be seen with spasm.  No fracture. Electronically Signed   By: Ellery Plunk M.D.   On: 09/24/2015 06:24   I have personally reviewed and evaluated these images and lab results as part of my medical decision-making.   EKG Interpretation None      MDM   Final diagnoses:  Cervical paraspinal muscle spasm    Patient was seen today for continued neck pain. There is evidence of muscle spasm. Patient is not currently on any anti-inflammatory medications. She was given Toradol and Valium with some improvement of her pain. Plain films are negative for bony abnormality. She is neurologically intact and denies radicular symptoms.  Plain films indicate likely spasm. On recheck, patient reports some improvement of pain. Instructed the patient to discontinue Flexeril. She will be given a short course of Valium. She should also add an anti-inflammatory medication like ibuprofen 600 mg every 6 hours.  Patient was instructed not to drive while under the influence of Valium.   After history, exam, and medical workup I feel the patient has been appropriately medically screened and is  safe for discharge home. Pertinent diagnoses were discussed with the patient. Patient was given return precautions.     Shon Baton, MD 09/24/15 [phone]

## 2015-09-24 NOTE — ED Notes (Signed)
Pt continues to have right sided neck pain . Denies injury or trauma. States she was given pain medication and muscle relaxer but has not relief.

## 2015-09-24 NOTE — Discharge Instructions (Signed)
You were seen today for continued neck pain. This is likely related to muscle spasms in the neck. Your x-rays are reassuring but do reflect some straightening which could be related with spasm. You should discontinue using Flexeril at home. You'll be given Valium for muscle relaxation. You should also begin taking ibuprofen 600 mg every 6 hours for anti-inflammatory effect. Limit use to one week. You should not drive or operate heavy machinery while under the influence of Valium.  Muscle Cramps and Spasms Muscle cramps and spasms occur when a muscle or muscles tighten and you have no control over this tightening (involuntary muscle contraction). They are a common problem and can develop in any muscle. The most common place is in the calf muscles of the leg. Both muscle cramps and muscle spasms are involuntary muscle contractions, but they also have differences:   Muscle cramps are sporadic and painful. They may last a few seconds to a quarter of an hour. Muscle cramps are often more forceful and last longer than muscle spasms.  Muscle spasms may or may not be painful. They may also last just a few seconds or much longer. CAUSES  It is uncommon for cramps or spasms to be due to a serious underlying problem. In many cases, the cause of cramps or spasms is unknown. Some common causes are:   Overexertion.   Overuse from repetitive motions (doing the same thing over and over).   Remaining in a certain position for a long period of time.   Improper preparation, form, or technique while performing a sport or activity.   Dehydration.   Injury.   Side effects of some medicines.   Abnormally low levels of the salts and ions in your blood (electrolytes), especially potassium and calcium. This could happen if you are taking water pills (diuretics) or you are pregnant.  Some underlying medical problems can make it more likely to develop cramps or spasms. These include, but are not limited to:     Diabetes.   Parkinson disease.   Hormone disorders, such as thyroid problems.   Alcohol abuse.   Diseases specific to muscles, joints, and bones.   Blood vessel disease where not enough blood is getting to the muscles.  HOME CARE INSTRUCTIONS   Stay well hydrated. Drink enough water and fluids to keep your urine clear or pale yellow.  It may be helpful to massage, stretch, and relax the affected muscle.  For tight or tense muscles, use a warm towel, heating pad, or hot shower water directed to the affected area.  If you are sore or have pain after a cramp or spasm, applying ice to the affected area may relieve discomfort.  Put ice in a plastic bag.  Place a towel between your skin and the bag.  Leave the ice on for 15-20 minutes, 03-04 times a day.  Medicines used to treat a known cause of cramps or spasms may help reduce their frequency or severity. Only take over-the-counter or prescription medicines as directed by your caregiver. SEEK MEDICAL CARE IF:  Your cramps or spasms get more severe, more frequent, or do not improve over time.  MAKE SURE YOU:   Understand these instructions.  Will watch your condition.  Will get help right away if you are not doing well or get worse.   This information is not intended to replace advice given to you by your health care provider. Make sure you discuss any questions you have with your health care provider.  Document Released: 04/02/2002 Document Revised: 02/05/2013 Document Reviewed: 09/27/2012 Elsevier Interactive Patient Education [employer].

## 2016-01-10 ENCOUNTER — Emergency Department (HOSPITAL_BASED_OUTPATIENT_CLINIC_OR_DEPARTMENT_OTHER)
Admission: EM | Admit: 2016-01-10 | Discharge: 2016-01-10 | Disposition: A | Discharge status: Home / Self Care | Payer: 59 | Attending: Emergency Medicine | Admitting: Emergency Medicine

## 2016-01-10 ENCOUNTER — Encounter (HOSPITAL_BASED_OUTPATIENT_CLINIC_OR_DEPARTMENT_OTHER): Payer: Self-pay

## 2016-01-10 DIAGNOSIS — Z79899 Other long term (current) drug therapy: Secondary | ICD-10-CM | POA: Diagnosis not present

## 2016-01-10 DIAGNOSIS — I10 Essential (primary) hypertension: Secondary | ICD-10-CM | POA: Insufficient documentation

## 2016-01-10 DIAGNOSIS — L299 Pruritus, unspecified: Secondary | ICD-10-CM | POA: Insufficient documentation

## 2016-01-10 DIAGNOSIS — R21 Rash and other nonspecific skin eruption: Secondary | ICD-10-CM | POA: Diagnosis present

## 2016-01-10 DIAGNOSIS — E876 Hypokalemia: Secondary | ICD-10-CM | POA: Diagnosis not present

## 2016-01-10 HISTORY — DX: Hypokalemia: E87.6

## 2016-01-10 MED ORDER — LORATADINE 10 MG PO TABS: 10.0000 mg | ORAL_TABLET | Freq: Every day | ORAL | Status: AC

## 2016-01-10 MED ORDER — LORATADINE 10 MG PO TABS
10.0000 mg | ORAL_TABLET | Freq: Once | ORAL | Status: AC
  Administered 2016-01-10: 10 mg via ORAL
  Filled 2016-01-10: qty 1

## 2016-01-10 NOTE — ED Notes (Signed)
Pt has two itchy spots on her left upper breast and left upper arm for at least a week which she has been prescribed anti itch cream for, but states it is not working.  Pt also has a cold and is c/o a swollen lymph node in her neck.

## 2016-01-10 NOTE — Discharge Instructions (Signed)
Pruritus °Pruritus is an itching feeling. There are many different conditions and factors that can make your skin itchy. Dry skin is one of the most common causes of itching. Most cases of itching do not require medical attention. Itchy skin can turn into a rash.  °HOME CARE INSTRUCTIONS  °Watch your pruritus for any changes. Take these steps to help with your condition:  °Skin Care °· Moisturize your skin as needed. A moisturizer that contains petroleum jelly is best for keeping moisture in your skin. °· Take or apply medicines only as directed by your health care provider. This may include: °¨ Corticosteroid cream. °¨ Anti-itch lotions. °¨ Oral anti-histamines. °· Apply cool compresses to the affected areas. °· Try taking a bath with: °¨ Epsom salts. Follow the instructions on the packaging. You can get these at your local pharmacy or grocery store. °¨ Baking soda. Pour a small amount into the bath as directed by your health care provider. °¨ Colloidal oatmeal. Follow the instructions on the packaging. You can get this at your local pharmacy or grocery store. °· Try applying baking soda paste to your skin. Stir water into baking soda until it reaches a paste-like consistency.   °· Do not scratch your skin. °· Avoid hot showers or baths, which can make itching worse. A cold shower may help with itching as long as you use a moisturizer after. °· Avoid scented soaps, detergents, and perfumes. Use gentle soaps, detergents, perfumes, and other cosmetic products. °General Instructions °· Avoid wearing tight clothes. °· Keep a journal to help track what causes your itch. Write down: °¨ What you eat. °¨ What cosmetic products you use. °¨ What you drink. °¨ What you wear. This includes jewelry. °· Use a humidifier. This keeps the air moist, which helps to prevent dry skin. °SEEK MEDICAL CARE IF: °· The itching does not go away after several days. °· You sweat at night. °· You have weight loss. °· You are unusually  thirsty. °· You urinate more than normal. °· You are more tired than normal. °· You have abdominal pain. °· Your skin tingles. °· You feel weak. °· Your skin or the whites of your eyes look yellow (jaundice). °· Your skin feels numb. °  °This information is not intended to replace advice given to you by your health care provider. Make sure you discuss any questions you have with your health care provider. °  °Document Released: 06/23/2011 Document Revised: 02/25/2015 Document Reviewed: 10/07/2014 °Elsevier Interactive Patient Education ©2016 Elsevier Inc. ° °

## 2016-01-10 NOTE — ED Provider Notes (Signed)
CSN: [id number]     Arrival date & time 3/Julie/17  0532 History   First MD Initiated Contact with Patient 03/Julie/17 [phone]     Chief Complaint  Patient presents with  . Rash     (Consider location/radiation/quality/duration/timing/severity/associated sxs/prior Treatment) Patient is a 50 y.o. Julie [name] presenting with rash. The history is provided by the patient.  Rash Location: left medial upper arm and left chest wall. Quality: itchiness   Quality: not bruising ( left medial upper arm and left chest wall), not painful, not scaling and not weeping   Severity:  Mild Onset quality:  Gradual Timing:  Intermittent Progression:  Unchanged Chronicity:  Recurrent Context: not animal contact   Relieved by:  Nothing Worsened by:  Nothing tried Ineffective treatments:  None tried Associated symptoms: no abdominal pain and no induration     Past Medical History  Diagnosis Date  . Hypertension   . Hypokalemia    Past Surgical History  Procedure Laterality Date  . Tubal ligation    . Endometrial ablation     Family History  Problem Relation Age of Onset  . Hypertension Mother   . Hypertension Father   . Diabetes Father   . Hypertension Sister   . Heart attack Neg Hx   . Sudden death Neg Hx   . Hyperlipidemia Neg Hx    Social History  Substance Use Topics  . Smoking status: Never Smoker   . Smokeless tobacco: Never Used  . Alcohol Use: No   OB History    No data available     Review of Systems  Gastrointestinal: Negative for abdominal pain.  Skin: Positive for rash.  All other systems reviewed and are negative.     Allergies  Review of patient's allergies indicates no known allergies.  Home Medications   Prior to Admission medications   Medication Sig Start Date End Date Taking? Authorizing Provider  acetaminophen (TYLENOL) 500 MG tablet Take 500 mg by mouth every 6 (six) hours as needed. For pain     Historical Provider, MD  diazepam (VALIUM) 5 MG tablet Take 1  tablet (5 mg total) by mouth every 8 (eight) hours as needed for muscle spasms. 09/24/15   Shon Baton, MD  HYDROcodone-acetaminophen (NORCO) 5-325 MG tablet Take 1-2 tablets by mouth every 6 (six) hours as needed (for pain). 09/19/15   John Molpus, MD  ibuprofen (ADVIL,MOTRIN) 600 MG tablet Take 1 tablet (600 mg total) by mouth every 6 (six) hours as needed. Limit use to 5-7 days 09/24/15   Shon Baton, MD  loratadine (CLARITIN) 10 MG tablet Take 1 tablet (10 mg total) by mouth daily. 3/Julie/17   Ronne Stefanski, MD  meclizine (ANTIVERT) 50 MG tablet Take 1 tablet (50 mg total) by mouth 3 (three) times daily as needed. 06/21/15   Nelva Nay, MD  metoprolol-hydrochlorothiazide (LOPRESSOR HCT) 50-25 MG per tablet Take 1 tablet by mouth daily.      Historical Provider, MD  potassium chloride (K-DUR) 10 MEQ tablet Take 10 mEq by mouth daily.    Historical Provider, MD  pseudoephedrine (SUDAFED) 60 MG tablet Take 1 tablet (60 mg total) by mouth every 8 (eight) hours as needed for congestion. 08/03/15   Joycie Peek, PA-C   BP 141/94 mmHg  Pulse 82  Temp(Src) 98.4 F (36.9 C) (Oral)  Resp 16  Ht  (1.651 m)  Wt 230 lb (104.327 kg)  BMI 38.27 kg/m2  SpO2 100% Physical Exam  Constitutional: She is oriented  to person, place, and time. She appears well-developed and well-nourished. No distress.  HENT:  Head: Normocephalic and atraumatic.  Mouth/Throat: Oropharynx is clear and moist.  Eyes: Conjunctivae are normal. Pupils are equal, round, and reactive to light.  Neck: Normal range of motion. Neck supple.  Cardiovascular: Normal rate, regular rhythm and intact distal pulses.   Pulmonary/Chest: Effort normal and breath sounds normal. She has no wheezes. She has no rales.  Abdominal: Soft. Bowel sounds are normal. There is no tenderness. There is no rebound and no guarding.  Musculoskeletal: Normal range of motion.  Lymphadenopathy:    She has no cervical adenopathy.   Neurological: She is alert and oriented to person, place, and time.  Skin: Skin is warm and dry.  Psychiatric: She has a normal mood and affect.    ED Course  Procedures (including critical care time) Labs Review Labs Reviewed - No data to display  Imaging Review No results found. I have personally reviewed and evaluated these images and lab results as part of my medical decision-making.   EKG Interpretation None      MDM   Final diagnoses:  Itching   Already on triamcinolone.  No lesions at this time.  Will add claritin to the cream regimen.  Follow up with your PMD    Lemonte Al, MD 03/Julie/17 [phone]

## 2016-01-10 NOTE — ED Notes (Signed)
Pt verbalizes understanding of d/c instructions and denies any further need at this time. 

## 2016-03-01 ENCOUNTER — Emergency Department (HOSPITAL_BASED_OUTPATIENT_CLINIC_OR_DEPARTMENT_OTHER)
Admission: EM | Admit: 2016-03-01 | Discharge: 2016-03-01 | Disposition: A | Discharge status: Home / Self Care | Payer: 59 | Attending: Emergency Medicine | Admitting: Emergency Medicine

## 2016-03-01 ENCOUNTER — Encounter (HOSPITAL_BASED_OUTPATIENT_CLINIC_OR_DEPARTMENT_OTHER): Payer: Self-pay | Admitting: Emergency Medicine

## 2016-03-01 DIAGNOSIS — M25511 Pain in right shoulder: Secondary | ICD-10-CM | POA: Diagnosis present

## 2016-03-01 DIAGNOSIS — M542 Cervicalgia: Secondary | ICD-10-CM | POA: Insufficient documentation

## 2016-03-01 DIAGNOSIS — I10 Essential (primary) hypertension: Secondary | ICD-10-CM | POA: Insufficient documentation

## 2016-03-01 MED ORDER — DICLOFENAC SODIUM ER 100 MG PO TB24: 100.0000 mg | ORAL_TABLET | Freq: Every day | ORAL | Status: DC

## 2016-03-01 MED ORDER — LIDOCAINE 5 % EX PTCH: 1.0000 | MEDICATED_PATCH | CUTANEOUS | Status: DC

## 2016-03-01 MED ORDER — PREDNISONE 50 MG PO TABS
60.0000 mg | ORAL_TABLET | Freq: Once | ORAL | Status: AC
  Administered 2016-03-01: 60 mg via ORAL
  Filled 2016-03-01: qty 1

## 2016-03-01 MED ORDER — KETOROLAC TROMETHAMINE 60 MG/2ML IM SOLN
60.0000 mg | Freq: Once | INTRAMUSCULAR | Status: AC
  Administered 2016-03-01: 60 mg via INTRAMUSCULAR
  Filled 2016-03-01: qty 2

## 2016-03-01 MED ORDER — METHOCARBAMOL 500 MG PO TABS: 500.0000 mg | ORAL_TABLET | Freq: Two times a day (BID) | ORAL | Status: DC

## 2016-03-01 MED ORDER — METHOCARBAMOL 500 MG PO TABS
1000.0000 mg | ORAL_TABLET | Freq: Once | ORAL | Status: AC
  Administered 2016-03-01: 1000 mg via ORAL
  Filled 2016-03-01: qty 2

## 2016-03-01 NOTE — ED Provider Notes (Addendum)
CSN: [id number]     Arrival date & time 03/01/16  0534 History   First MD Initiated Contact with Patient 03/01/16 [phone]     Chief Complaint  Patient presents with  . Shoulder Pain     (Consider location/radiation/quality/duration/timing/severity/associated sxs/prior Treatment) Patient is a 50 y.o. female presenting with shoulder pain. The history is provided by the patient.  Shoulder Pain Location:  Shoulder Time since incident: many years. Shoulder location:  R shoulder Pain details:    Quality:  Aching   Radiates to:  L shoulder   Severity:  Moderate   Onset quality:  Gradual   Duration: years.   Timing:  Constant   Progression:  Unchanged Chronicity:  Chronic Handedness:  Right-handed Dislocation: no   Foreign body present:  No foreign bodies Relieved by:  Nothing Worsened by:  Nothing tried Ineffective treatments:  Acetaminophen Associated symptoms: neck pain   Associated symptoms: no back pain, no decreased range of motion, no fatigue, no fever, no muscle weakness, no numbness, no stiffness and no tingling   Associated symptoms comment:  Chronic right neck pain as well Risk factors: no concern for non-accidental trauma   Sees sports medicine and they do injections hasn't been there in "a while" but states they dont help  Past Medical History  Diagnosis Date  . Hypertension   . Hypokalemia    Past Surgical History  Procedure Laterality Date  . Tubal ligation    . Endometrial ablation     Family History  Problem Relation Age of Onset  . Hypertension Mother   . Hypertension Father   . Diabetes Father   . Hypertension Sister   . Heart attack Neg Hx   . Sudden death Neg Hx   . Hyperlipidemia Neg Hx    Social History  Substance Use Topics  . Smoking status: Never Smoker   . Smokeless tobacco: Never Used  . Alcohol Use: No   OB History    No data available     Review of Systems  Constitutional: Negative for fever and fatigue.  Respiratory: Negative for  shortness of breath.   Cardiovascular: Negative for chest pain, palpitations and leg swelling.  Musculoskeletal: Positive for neck pain. Negative for myalgias, back pain, stiffness and neck stiffness.  Neurological: Negative for dizziness, facial asymmetry, weakness and numbness.  All other systems reviewed and are negative.     Allergies  Review of patient's allergies indicates no known allergies.  Home Medications   Prior to Admission medications   Medication Sig Start Date End Date Taking? Authorizing Provider  acetaminophen (TYLENOL) 500 MG tablet Take 500 mg by mouth every 6 (six) hours as needed. For pain     Historical Provider, MD  diazepam (VALIUM) 5 MG tablet Take 1 tablet (5 mg total) by mouth every 8 (eight) hours as needed for muscle spasms. 09/24/15   Shon Baton, MD  HYDROcodone-acetaminophen (NORCO) 5-325 MG tablet Take 1-2 tablets by mouth every 6 (six) hours as needed (for pain). 09/19/15   John Molpus, MD  ibuprofen (ADVIL,MOTRIN) 600 MG tablet Take 1 tablet (600 mg total) by mouth every 6 (six) hours as needed. Limit use to 5-7 days 09/24/15   Shon Baton, MD  loratadine (CLARITIN) 10 MG tablet Take 1 tablet (10 mg total) by mouth daily. 01/10/16   Haileigh Pitz, MD  meclizine (ANTIVERT) 50 MG tablet Take 1 tablet (50 mg total) by mouth 3 (three) times daily as needed. 06/21/15   Nelva Nay, MD  metoprolol-hydrochlorothiazide (LOPRESSOR HCT) 50-25 MG per tablet Take 1 tablet by mouth daily.      Historical Provider, MD  potassium chloride (K-DUR) 10 MEQ tablet Take 10 mEq by mouth daily.    Historical Provider, MD  pseudoephedrine (SUDAFED) 60 MG tablet Take 1 tablet (60 mg total) by mouth every 8 (eight) hours as needed for congestion. 08/03/15   Benjamin Cartner, PA-C   BP 156/96 mmHg  Pulse 73  Temp(Src) 98.3 F (36.8 C) (Oral)  Resp 20  Ht 5\' 5"  (1.651 m)  Wt 235 lb (106.595 kg)  BMI 39.11 kg/m2  SpO2 100% Physical Exam  Constitutional: She is  oriented to person, place, and time. She appears well-developed and well-nourished. No distress.  HENT:  Head: Normocephalic and atraumatic.  Mouth/Throat: Oropharynx is clear and moist.  Eyes: Pupils are equal, round, and reactive to light.  Neck: Normal range of motion. Neck supple. No JVD present.  Cardiovascular: Normal rate, regular rhythm and intact distal pulses.   Pulmonary/Chest: Effort normal and breath sounds normal. No stridor. She has no wheezes. She has no rales.  Abdominal: Soft. Bowel sounds are normal. There is no tenderness. There is no rebound and no guarding.  Musculoskeletal: Normal range of motion. She exhibits no edema or tenderness.       Right shoulder: Normal.       Cervical back: Normal.       Thoracic back: Normal.       Right upper arm: Normal.       Right forearm: Normal.       Right hand: Normal. She exhibits normal capillary refill. Normal sensation noted. Normal strength noted.  Negative Neers test of the right shoulder no winging of the scapula.  No palpable spasm  Lymphadenopathy:    She has no cervical adenopathy.  Neurological: She is alert and oriented to person, place, and time. She has normal reflexes. She displays normal reflexes.  Skin: Skin is warm and dry.  Psychiatric: She has a normal mood and affect.    ED Course  Procedures (including critical care time) Labs Review Labs Reviewed - No data to display  Imaging Review No results found. I have personally reviewed and evaluated these images and lab results as part of my medical decision-making.   EKG Interpretation None      MDM   Final diagnoses:  None   Chronic neck and shoulder pain.  Will refer to orthopedics.  NSAIDs, muscle relaxants and lidoderm patches.  We will not be prescribing narcotics or benzos for chronic pain    Oland Arquette, MD 03/01/16 0606  Hiilei Gerst, MD 03/01/16 [phone]

## 2016-03-01 NOTE — ED Notes (Signed)
Pt states chronic pain in R shoulder and neck. States she has been seeing "a sports medicine doctor" but she isn't getting any relief. Reports taking OTC pain relievers at home without relief.

## 2016-03-01 NOTE — Discharge Instructions (Signed)
Heat Therapy °Heat therapy can help ease sore, stiff, injured, and tight muscles and joints. Heat relaxes your muscles, which may help ease your pain. Heat therapy should only be used on old, pre-existing, or long-lasting (chronic) injuries. Do not use heat therapy unless told by your doctor. °HOW TO USE HEAT THERAPY °There are several different kinds of heat therapy, including: °· Moist heat pack. °· Warm water bath. °· Hot water bottle. °· Electric heating pad. °· Heated gel pack. °· Heated wrap. °· Electric heating pad. °GENERAL HEAT THERAPY RECOMMENDATIONS  °· Do not sleep while using heat therapy. Only use heat therapy while you are awake. °· Your skin may turn pink while using heat therapy. Do not use heat therapy if your skin turns red. °· Do not use heat therapy if you have new pain. °· High heat or long exposure to heat can cause burns. Be careful when using heat therapy to avoid burning your skin. °· Do not use heat therapy on areas of your skin that are already irritated, such as with a rash or sunburn. °GET HELP IF:  °· You have blisters, redness, swelling (puffiness), or numbness. °· You have new pain. °· Your pain is worse. °MAKE SURE YOU: °· Understand these instructions. °· Will watch your condition. °· Will get help right away if you are not doing well or get worse. °  °This information is not intended to replace advice given to you by your health care provider. Make sure you discuss any questions you have with your health care provider. °  °Document Released: 01/03/2012 Document Revised: 11/01/2014 Document Reviewed: 12/04/2013 °Elsevier Interactive Patient Education ©2016 [employer] ° °

## 2016-03-24 DIAGNOSIS — Y999 Unspecified external cause status: Secondary | ICD-10-CM | POA: Diagnosis not present

## 2016-03-24 DIAGNOSIS — Y929 Unspecified place or not applicable: Secondary | ICD-10-CM | POA: Diagnosis not present

## 2016-03-24 DIAGNOSIS — Z79899 Other long term (current) drug therapy: Secondary | ICD-10-CM | POA: Insufficient documentation

## 2016-03-24 DIAGNOSIS — I10 Essential (primary) hypertension: Secondary | ICD-10-CM | POA: Insufficient documentation

## 2016-03-24 DIAGNOSIS — Y939 Activity, unspecified: Secondary | ICD-10-CM | POA: Diagnosis not present

## 2016-03-24 DIAGNOSIS — X58XXXA Exposure to other specified factors, initial encounter: Secondary | ICD-10-CM | POA: Insufficient documentation

## 2016-03-24 DIAGNOSIS — S134XXA Sprain of ligaments of cervical spine, initial encounter: Secondary | ICD-10-CM | POA: Diagnosis not present

## 2016-03-24 DIAGNOSIS — S199XXA Unspecified injury of neck, initial encounter: Secondary | ICD-10-CM | POA: Diagnosis present

## 2016-03-25 ENCOUNTER — Encounter (HOSPITAL_BASED_OUTPATIENT_CLINIC_OR_DEPARTMENT_OTHER): Payer: Self-pay | Admitting: *Deleted

## 2016-03-25 ENCOUNTER — Emergency Department (HOSPITAL_BASED_OUTPATIENT_CLINIC_OR_DEPARTMENT_OTHER)
Admission: EM | Admit: 2016-03-25 | Discharge: 2016-03-25 | Disposition: A | Discharge status: Home / Self Care | Payer: 59 | Attending: Emergency Medicine | Admitting: Emergency Medicine

## 2016-03-25 DIAGNOSIS — S139XXA Sprain of joints and ligaments of unspecified parts of neck, initial encounter: Secondary | ICD-10-CM

## 2016-03-25 MED ORDER — NAPROXEN 500 MG PO TABS: 500.0000 mg | ORAL_TABLET | Freq: Two times a day (BID) | ORAL | Status: DC

## 2016-03-25 MED ORDER — DIAZEPAM 5 MG PO TABS: 5.0000 mg | ORAL_TABLET | Freq: Three times a day (TID) | ORAL | Status: DC | PRN

## 2016-03-25 NOTE — ED Notes (Signed)
Pt c/o neck pain and upper back pain x 3 weeks

## 2016-03-25 NOTE — ED Provider Notes (Signed)
CSN: [id number]     Arrival date & time 03/24/16  2356 History   First MD Initiated Contact with Patient 03/25/16 0016     Chief Complaint  Patient presents with  . Neck Pain     (Consider location/radiation/quality/duration/timing/severity/associated sxs/prior Treatment) HPI Comments: Patient presents today with a chief complaint of bilateral neck pain.  She reports that the pain has been present for months.  No acute injury or trauma.  Pain is worse with extension and lateral rotation of the neck.  She states that the pain mildly improves with massage. She denies any headache, fever, chills, numbness, tingling, or weakness.  She has been seen in the ED two times previously for this pain, most recently on 03/01/16.  She was prescribed Robaxin and Lidoderm patch at that time.  She reports taking the medication, but does not feel that it is helping.  She was also given referral to an Orthopedist, but states that she has not followed up.    Patient is a 50 y.o. female presenting with neck pain. The history is provided by the patient.  Neck Pain   Past Medical History  Diagnosis Date  . Hypertension   . Hypokalemia    Past Surgical History  Procedure Laterality Date  . Tubal ligation    . Endometrial ablation     Family History  Problem Relation Age of Onset  . Hypertension Mother   . Hypertension Father   . Diabetes Father   . Hypertension Sister   . Heart attack Neg Hx   . Sudden death Neg Hx   . Hyperlipidemia Neg Hx    Social History  Substance Use Topics  . Smoking status: Never Smoker   . Smokeless tobacco: Never Used  . Alcohol Use: No   OB History    No data available     Review of Systems  Musculoskeletal: Positive for neck pain.  All other systems reviewed and are negative.     Allergies  Review of patient's allergies indicates no known allergies.  Home Medications   Prior to Admission medications   Medication Sig Start Date End Date Taking? Authorizing  Provider  acetaminophen (TYLENOL) 500 MG tablet Take 500 mg by mouth every 6 (six) hours as needed. For pain     Historical Provider, MD  diazepam (VALIUM) 5 MG tablet Take 1 tablet (5 mg total) by mouth every 8 (eight) hours as needed for muscle spasms. 09/24/15   Shon Batonourtney F Horton, MD  Diclofenac Sodium CR (VOLTAREN-XR) 100 MG 24 hr tablet Take 1 tablet (100 mg total) by mouth daily. 03/01/16   April Palumbo, MD  HYDROcodone-acetaminophen (NORCO) 5-325 MG tablet Take 1-2 tablets by mouth every 6 (six) hours as needed (for pain). 09/19/15   John Molpus, MD  ibuprofen (ADVIL,MOTRIN) 600 MG tablet Take 1 tablet (600 mg total) by mouth every 6 (six) hours as needed. Limit use to 5-7 days 09/24/15   Shon Batonourtney F Horton, MD  lidocaine (LIDODERM) 5 % Place 1 patch onto the skin daily. Remove & Discard patch within 12 hours or as directed by MD 03/01/16   April Palumbo, MD  loratadine (CLARITIN) 10 MG tablet Take 1 tablet (10 mg total) by mouth daily. 01/10/16   April Palumbo, MD  meclizine (ANTIVERT) 50 MG tablet Take 1 tablet (50 mg total) by mouth 3 (three) times daily as needed. 06/21/15   Nelva Nayobert Beaton, MD  methocarbamol (ROBAXIN) 500 MG tablet Take 1 tablet (500 mg total) by mouth 2 (  two) times daily. 03/01/16   April Palumbo, MD  metoprolol-hydrochlorothiazide (LOPRESSOR HCT) 50-25 MG per tablet Take 1 tablet by mouth daily.      Historical Provider, MD  potassium chloride (K-DUR) 10 MEQ tablet Take 10 mEq by mouth daily.    Historical Provider, MD  pseudoephedrine (SUDAFED) 60 MG tablet Take 1 tablet (60 mg total) by mouth every 8 (eight) hours as needed for congestion. 08/03/15   Joycie Peek, PA-C   BP 129/85 mmHg  Pulse 63  Temp(Src) 98.9 F (37.2 C) (Oral)  Resp 18  SpO2 100% Physical Exam  Constitutional: She appears well-developed and well-nourished.  HENT:  Head: Normocephalic and atraumatic.  Neck: Normal range of motion. Neck supple.  Full ROM of the neck Increased pain with extension  and lateral rotation of the neck  Cardiovascular: Normal rate, regular rhythm and normal heart sounds.   Pulmonary/Chest: Effort normal and breath sounds normal.  Musculoskeletal: Normal range of motion.  Neurological: She is alert.  Distal sensation of both hands intact Grip strength 5/5 bilaterally  Skin: Skin is warm and dry.  Psychiatric: She has a normal mood and affect.  Nursing note and vitals reviewed.   ED Course  Procedures (including critical care time) Labs Review Labs Reviewed - No data to display  Imaging Review No results found. I have personally reviewed and evaluated these images and lab results as part of my medical decision-making.   EKG Interpretation None      MDM   Final diagnoses:  None   Patient presents today with chronic bilateral neck pain.  Pain worse with movement.  No acute injury or trauma.  Distal sensation of both hands intact.  Grip strength 5/5 bilaterally.  Feel that the patient is stable for discharge.  Given referral to Dr. Pearletha Forge with Sports Medicine/Orthopedics.  Return precautions given.    Santiago Glad, PA-C 03/25/16 0103  Paula Libra, MD 03/25/16 [phone]

## 2016-05-10 ENCOUNTER — Emergency Department (HOSPITAL_BASED_OUTPATIENT_CLINIC_OR_DEPARTMENT_OTHER)
Admission: EM | Admit: 2016-05-10 | Discharge: 2016-05-10 | Disposition: A | Discharge status: Home / Self Care | Payer: 59 | Attending: Emergency Medicine | Admitting: Emergency Medicine

## 2016-05-10 ENCOUNTER — Encounter (HOSPITAL_BASED_OUTPATIENT_CLINIC_OR_DEPARTMENT_OTHER): Payer: Self-pay | Admitting: *Deleted

## 2016-05-10 DIAGNOSIS — I10 Essential (primary) hypertension: Secondary | ICD-10-CM | POA: Insufficient documentation

## 2016-05-10 DIAGNOSIS — M542 Cervicalgia: Secondary | ICD-10-CM | POA: Diagnosis not present

## 2016-05-10 DIAGNOSIS — M25511 Pain in right shoulder: Secondary | ICD-10-CM | POA: Diagnosis present

## 2016-05-10 DIAGNOSIS — Z79899 Other long term (current) drug therapy: Secondary | ICD-10-CM | POA: Diagnosis not present

## 2016-05-10 MED ORDER — DIAZEPAM 5 MG PO TABS: 5.0000 mg | ORAL_TABLET | Freq: Three times a day (TID) | ORAL | Status: DC | PRN

## 2016-05-10 MED ORDER — NAPROXEN 500 MG PO TABS: 500.0000 mg | ORAL_TABLET | Freq: Two times a day (BID) | ORAL | Status: DC

## 2016-05-10 MED ORDER — KETOROLAC TROMETHAMINE 30 MG/ML IJ SOLN
30.0000 mg | Freq: Once | INTRAMUSCULAR | Status: AC
  Administered 2016-05-10: 30 mg via INTRAMUSCULAR
  Filled 2016-05-10: qty 1

## 2016-05-10 NOTE — ED Notes (Signed)
Pain in her right shoulder. Hx of bone spur. States is suppose to have surgery in December.

## 2016-05-10 NOTE — ED Provider Notes (Signed)
CSN: [id number]     Arrival date & time 05/10/16  1134 History   First MD Initiated Contact with Patient 05/10/16 1218     Chief Complaint  Patient presents with  . Shoulder Pain     (Consider location/radiation/quality/duration/timing/severity/associated sxs/prior Treatment) HPI Comments: 50 year old female presents for shoulder pain. The patient reports that she has had this pain ongoing and that she has seen a specialist and had an MRI. She was told that she has a bone spur that is causing the pain. She states that it is a spasming pain between the right side of her neck and her right shoulder. She is supposed to have surgery but not until December. She reports that the pain has been on managed with over-the-counter medications. She said that the medications she was prescribed here last time have helped with it. Denies injury.   Past Medical History  Diagnosis Date  . Hypertension   . Hypokalemia    Past Surgical History  Procedure Laterality Date  . Tubal ligation    . Endometrial ablation     Family History  Problem Relation Age of Onset  . Hypertension Mother   . Hypertension Father   . Diabetes Father   . Hypertension Sister   . Heart attack Neg Hx   . Sudden death Neg Hx   . Hyperlipidemia Neg Hx    Social History  Substance Use Topics  . Smoking status: Never Smoker   . Smokeless tobacco: Never Used  . Alcohol Use: No   OB History    No data available     Review of Systems  Constitutional: Negative for fever, chills and fatigue.  HENT: Negative for congestion, postnasal drip and rhinorrhea.   Eyes: Negative for visual disturbance.  Respiratory: Negative for cough, chest tightness, shortness of breath and wheezing.   Cardiovascular: Negative for chest pain, palpitations and leg swelling.  Gastrointestinal: Negative for nausea, vomiting, abdominal pain and diarrhea.  Genitourinary: Negative for dysuria, urgency and hematuria.  Musculoskeletal: Positive for  arthralgias (right shoulder pain). Negative for myalgias, joint swelling, gait problem and neck stiffness.  Skin: Negative for rash.  Neurological: Negative for dizziness, seizures, syncope, weakness, numbness and headaches.  Hematological: Does not bruise/bleed easily.      Allergies  Review of patient's allergies indicates no known allergies.  Home Medications   Prior to Admission medications   Medication Sig Start Date End Date Taking? Authorizing Provider  acetaminophen (TYLENOL) 500 MG tablet Take 500 mg by mouth every 6 (six) hours as needed. For pain    Yes Historical Provider, MD  lidocaine (LIDODERM) 5 % Place 1 patch onto the skin daily. Remove & Discard patch within 12 hours or as directed by MD 03/01/16  Yes April Palumbo, MD  metoprolol-hydrochlorothiazide (LOPRESSOR HCT) 50-25 MG per tablet Take 1 tablet by mouth daily.     Yes Historical Provider, MD  potassium chloride (K-DUR) 10 MEQ tablet Take 10 mEq by mouth daily.   Yes Historical Provider, MD  diazepam (VALIUM) 5 MG tablet Take 1 tablet (5 mg total) by mouth every 8 (eight) hours as needed for muscle spasms. 05/10/16   Leta BaptistEmily Roe Nguyen, MD  Diclofenac Sodium CR (VOLTAREN-XR) 100 MG 24 hr tablet Take 1 tablet (100 mg total) by mouth daily. 03/01/16   April Palumbo, MD  HYDROcodone-acetaminophen (NORCO) 5-325 MG tablet Take 1-2 tablets by mouth every 6 (six) hours as needed (for pain). 09/19/15   John Molpus, MD  ibuprofen (ADVIL,MOTRIN) 600 MG  tablet Take 1 tablet (600 mg total) by mouth every 6 (six) hours as needed. Limit use to 5-7 days 09/24/15   Shon Baton, MD  loratadine (CLARITIN) 10 MG tablet Take 1 tablet (10 mg total) by mouth daily. 01/10/16   April Palumbo, MD  meclizine (ANTIVERT) 50 MG tablet Take 1 tablet (50 mg total) by mouth 3 (three) times daily as needed. 06/21/15   Nelva Nay, MD  methocarbamol (ROBAXIN) 500 MG tablet Take 1 tablet (500 mg total) by mouth 2 (two) times daily. 03/01/16   April  Palumbo, MD  naproxen (NAPROSYN) 500 MG tablet Take 1 tablet (500 mg total) by mouth 2 (two) times daily. 05/10/16   Leta Baptist, MD  pseudoephedrine (SUDAFED) 60 MG tablet Take 1 tablet (60 mg total) by mouth every 8 (eight) hours as needed for congestion. 08/03/15   Benjamin Cartner, PA-C   BP 139/89 mmHg  Pulse 91  Temp(Src) 98.5 F (36.9 C) (Oral)  Resp 18  Ht  (1.651 m)  Wt 240 lb (108.863 kg)  BMI 39.94 kg/m2  SpO2 100% Physical Exam  Constitutional: She is oriented to person, place, and time. She appears well-developed and well-nourished. No distress.  HENT:  Head: Normocephalic and atraumatic.  Right Ear: External ear normal.  Left Ear: External ear normal.  Nose: Nose normal.  Mouth/Throat: Oropharynx is clear and moist. No oropharyngeal exudate.  Eyes: EOM are normal. Pupils are equal, round, and reactive to light.  Neck: Normal range of motion. Neck supple.  Cardiovascular: Normal rate, regular rhythm, normal heart sounds and intact distal pulses.   No murmur heard. Pulmonary/Chest: Effort normal. No respiratory distress. She has no wheezes. She has no rales.  Abdominal: Soft. She exhibits no distension. There is no tenderness.  Musculoskeletal: She exhibits no edema.       Right shoulder: She exhibits decreased range of motion, tenderness, bony tenderness, pain and spasm (over the right SCM). She exhibits no swelling, no effusion, no crepitus, no deformity, no laceration, normal pulse and normal strength.  Neurological: She is alert and oriented to person, place, and time. She has normal strength. No sensory deficit.  Skin: Skin is warm and dry. No rash noted. She is not diaphoretic.  Vitals reviewed.   ED Course  Procedures (including critical care time) Labs Review Labs Reviewed - No data to display  Imaging Review No results found. I have personally reviewed and evaluated these images and lab results as part of my medical decision-making.   EKG  Interpretation None      MDM  Patient was seen and evaluated in stable condition. Patient presenting with acute on chronic pain. I discussed with the patient as this is a chronic pain issue that she needs to follow-up outpatient for pain management. I did tell her that this last time we could refill the medications that they use last time or her pain. She was given a dose of Toradol and discharged with prescriptions for naproxen and Valium. Patient is to follow-up outpatient. She was discharged home in stable condition. It did not appear there was any indication for further imaging as her shoulder has been imaged in the past and has had a complete MRI for the same pain. Final diagnoses:  Neck pain  Shoulder pain, right    1. Right shoulder pain    Leta Baptist, MD 05/10/16 [phone]

## 2016-05-10 NOTE — Discharge Instructions (Signed)
You were seen and evaluated today for your shoulder and neck pain. This is now becoming a chronic issue. Chronic issues are best treated for your health and safety by someone who can follow you outpatient. Please follow-up with the physician that you are seen whether that be Dr. Christell ConstantMoore or your own primary care physician for further management of your pain. Take the medications prescribed today.   Shoulder Pain The shoulder is the joint that connects your arms to your body. The bones that form the shoulder joint include the upper arm bone (humerus), the shoulder blade (scapula), and the collarbone (clavicle). The top of the humerus is shaped like a ball and fits into a rather flat socket on the scapula (glenoid cavity). A combination of muscles and strong, fibrous tissues that connect muscles to bones (tendons) support your shoulder joint and hold the ball in the socket. Small, fluid-filled sacs (bursae) are located in different areas of the joint. They act as cushions between the bones and the overlying soft tissues and help reduce friction between the gliding tendons and the bone as you move your arm. Your shoulder joint allows a wide range of motion in your arm. This range of motion allows you to do things like scratch your back or throw a ball. However, this range of motion also makes your shoulder more prone to pain from overuse and injury. Causes of shoulder pain can originate from both injury and overuse and usually can be grouped in the following four categories:  Redness, swelling, and pain (inflammation) of the tendon (tendinitis) or the bursae (bursitis).  Instability, such as a dislocation of the joint.  Inflammation of the joint (arthritis).  Broken bone (fracture). HOME CARE INSTRUCTIONS   Apply ice to the sore area.  Put ice in a plastic bag.  Place a towel between your skin and the bag.  Leave the ice on for 15-20 minutes, 3-4 times per day for the first 2 days, or as directed by  your health care provider.  Stop using cold packs if they do not help with the pain.  If you have a shoulder sling or immobilizer, wear it as long as your caregiver instructs. Only remove it to shower or bathe. Move your arm as little as possible, but keep your hand moving to prevent swelling.  Squeeze a soft ball or foam pad as much as possible to help prevent swelling.  Only take over-the-counter or prescription medicines for pain, discomfort, or fever as directed by your caregiver. SEEK MEDICAL CARE IF:   Your shoulder pain increases, or new pain develops in your arm, hand, or fingers.  Your hand or fingers become cold and numb.  Your pain is not relieved with medicines. SEEK IMMEDIATE MEDICAL CARE IF:   Your arm, hand, or fingers are numb or tingling.  Your arm, hand, or fingers are significantly swollen or turn white or blue. MAKE SURE YOU:   Understand these instructions.  Will watch your condition.  Will get help right away if you are not doing well or get worse.   This information is not intended to replace advice given to you by your health care provider. Make sure you discuss any questions you have with your health care provider.   Document Released: 07/21/2005 Document Revised: 11/01/2014 Document Reviewed: 02/03/2015 Elsevier Interactive Patient Education [employer]2016 [employer]

## 2016-07-15 ENCOUNTER — Emergency Department (HOSPITAL_BASED_OUTPATIENT_CLINIC_OR_DEPARTMENT_OTHER)
Admission: EM | Admit: 2016-07-15 | Discharge: 2016-07-15 | Disposition: A | Discharge status: Home / Self Care | Payer: 59 | Attending: Emergency Medicine | Admitting: Emergency Medicine

## 2016-07-15 ENCOUNTER — Encounter (HOSPITAL_BASED_OUTPATIENT_CLINIC_OR_DEPARTMENT_OTHER): Payer: Self-pay | Admitting: Emergency Medicine

## 2016-07-15 DIAGNOSIS — R21 Rash and other nonspecific skin eruption: Secondary | ICD-10-CM | POA: Insufficient documentation

## 2016-07-15 DIAGNOSIS — Z79899 Other long term (current) drug therapy: Secondary | ICD-10-CM | POA: Insufficient documentation

## 2016-07-15 DIAGNOSIS — L299 Pruritus, unspecified: Secondary | ICD-10-CM | POA: Diagnosis present

## 2016-07-15 DIAGNOSIS — I10 Essential (primary) hypertension: Secondary | ICD-10-CM | POA: Diagnosis not present

## 2016-07-15 MED ORDER — HYDROCORTISONE 2.5 % EX LOTN: TOPICAL_LOTION | Freq: Two times a day (BID) | CUTANEOUS | 0 refills | Status: DC

## 2016-07-15 MED ORDER — HYDROXYZINE HCL 10 MG PO TABS: 10.0000 mg | ORAL_TABLET | Freq: Four times a day (QID) | ORAL | 0 refills | Status: DC | PRN

## 2016-07-15 MED ORDER — DIPHENHYDRAMINE HCL 25 MG PO TABS: 25.0000 mg | ORAL_TABLET | Freq: Four times a day (QID) | ORAL | 0 refills | Status: DC | PRN

## 2016-07-15 NOTE — ED Provider Notes (Signed)
[facility] Provider Note   CSN: [id number] Arrival date & time: 07/15/16  1801   By signing my name below, I, Freida Busman, attest that this documentation has been prepared under the direction and in the presence of non-physician practitioner, Everlene Farrier, PA-C. Electronically Signed: Freida Busman, Scribe. 07/15/2016. 6:58 PM.   History   Chief Complaint Chief Complaint  Patient presents with  . Rash    The history is provided by the patient. No language interpreter was used.    HPI Comments:  Julie [name] is a 50 y.o. female who presents to the Emergency Department complaining of a pruritic rash to her bilateral forearms and posterior neck which began this evening. She denies sick contacts at home. She denies itching between her web spaces. No recent insect bites, new pets, or changes to soaps/lotions/detergents. She denies fever, vomiting, diarrhea, SOB, tongue and lip swelling. She has taken benadryl with minimal relief. Pt has no other complaints or symptoms at this time.   Past Medical History:  Diagnosis Date  . Hypertension   . Hypokalemia     Patient Active Problem List   Diagnosis Date Noted  . [place] fracture 04/27/2011    Past Surgical History:  Procedure Laterality Date  . ENDOMETRIAL ABLATION    . TUBAL LIGATION      OB History    No data available       Home Medications    Prior to Admission medications   Medication Sig Start Date End Date Taking? Authorizing Provider  acetaminophen (TYLENOL) 500 MG tablet Take 500 mg by mouth every 6 (six) hours as needed. For pain     Historical Provider, MD  diazepam (VALIUM) 5 MG tablet Take 1 tablet (5 mg total) by mouth every 8 (eight) hours as needed for muscle spasms. 05/10/16   Leta Baptist, MD  Diclofenac Sodium CR (VOLTAREN-XR) 100 MG 24 hr tablet Take 1 tablet (100 mg total) by mouth daily. 03/01/16   April Palumbo, MD  diphenhydrAMINE (BENADRYL) 25 MG tablet Take 1 tablet (25 mg  total) by mouth every 6 (six) hours as needed for itching (Rash). 07/15/16   Everlene Farrier, PA-C  HYDROcodone-acetaminophen (NORCO) 5-325 MG tablet Take 1-2 tablets by mouth every 6 (six) hours as needed (for pain). 09/19/15   John Molpus, MD  hydrocortisone 2.5 % lotion Apply topically 2 (two) times daily. 07/15/16   Everlene Farrier, PA-C  hydrOXYzine (ATARAX/VISTARIL) 10 MG tablet Take 1 tablet (10 mg total) by mouth every 6 (six) hours as needed for itching. 07/15/16   Everlene Farrier, PA-C  ibuprofen (ADVIL,MOTRIN) 600 MG tablet Take 1 tablet (600 mg total) by mouth every 6 (six) hours as needed. Limit use to 5-7 days 09/24/15   Shon Baton, MD  lidocaine (LIDODERM) 5 % Place 1 patch onto the skin daily. Remove & Discard patch within 12 hours or as directed by MD 03/01/16   April Palumbo, MD  loratadine (CLARITIN) 10 MG tablet Take 1 tablet (10 mg total) by mouth daily. 01/10/16   April Palumbo, MD  meclizine (ANTIVERT) 50 MG tablet Take 1 tablet (50 mg total) by mouth 3 (three) times daily as needed. 06/21/15   Nelva Nay, MD  methocarbamol (ROBAXIN) 500 MG tablet Take 1 tablet (500 mg total) by mouth 2 (two) times daily. 03/01/16   April Palumbo, MD  metoprolol-hydrochlorothiazide (LOPRESSOR HCT) 50-25 MG per tablet Take 1 tablet by mouth daily.      Historical Provider, MD  naproxen (NAPROSYN) 500 MG tablet Take 1 tablet (500 mg total) by mouth 2 (two) times daily. 05/10/16   Leta Baptist, MD  potassium chloride (K-DUR) 10 MEQ tablet Take 10 mEq by mouth daily.    Historical Provider, MD  pseudoephedrine (SUDAFED) 60 MG tablet Take 1 tablet (60 mg total) by mouth every 8 (eight) hours as needed for congestion. 08/03/15   Joycie Peek, PA-C    Family History Family History  Problem Relation Age of Onset  . Hypertension Mother   . Hypertension Father   . Diabetes Father   . Hypertension Sister   . Heart attack Neg Hx   . Sudden death Neg Hx   . Hyperlipidemia Neg Hx     Social  History Social History  Substance Use Topics  . Smoking status: Never Smoker  . Smokeless tobacco: Never Used  . Alcohol use No     Allergies   Review of patient's allergies indicates no known allergies.   Review of Systems Review of Systems  Constitutional: Negative for chills and fever.  HENT: Negative for facial swelling and trouble swallowing.   Respiratory: Negative for cough, shortness of breath and wheezing.   Cardiovascular: Negative for chest pain.  Gastrointestinal: Negative for abdominal pain, diarrhea, nausea and vomiting.  Musculoskeletal: Negative for arthralgias and myalgias.  Skin: Positive for rash.     Physical Exam Updated Vital Signs BP 102/74 (BP Location: Right Arm)   Pulse 95   Temp 98.6 F (37 C) (Oral)   Resp 18   Ht 5\' 5"  (1.651 m)   Wt 106.1 kg   SpO2 98%   BMI 38.94 kg/m   Physical Exam  Constitutional: She appears well-developed and well-nourished. No distress.  Nontoxic appearing.  HENT:  Head: Normocephalic and atraumatic.  Mouth/Throat: Oropharynx is clear and moist.  Throat is clear. No tongue or lip swelling. Uvula is midline without edema.  Eyes: Conjunctivae are normal. Pupils are equal, round, and reactive to light. Right eye exhibits no discharge. Left eye exhibits no discharge.  Neck: Normal range of motion. Neck supple.  Cardiovascular: Normal rate, regular rhythm and intact distal pulses.   Pulmonary/Chest: Effort normal. No stridor. No respiratory distress.  Abdominal: Soft. There is no tenderness.  Neurological: She is alert. Coordination normal.  Skin: Skin is warm and dry. Capillary refill takes less than 2 seconds. Rash noted. No bruising, no ecchymosis and no petechiae noted. Rash is not vesicular. She is not diaphoretic. No erythema. No pallor.  Slight exocoriations to bilateral forearms and posterior neck. No vesicles, bullae, erythema, warmth, or hives noted. No abscess or fluctuance. No petechiae.   Psychiatric:  She has a normal mood and affect. Her behavior is normal.  Nursing note and vitals reviewed.    ED Treatments / Results  DIAGNOSTIC STUDIES:  Oxygen Saturation is 98% on RA, normal by my interpretation.    COORDINATION OF CARE:  6:58 PM Discussed treatment plan with pt at bedside and pt agreed to plan.  Labs (all labs ordered are listed, but only abnormal results are displayed) Labs Reviewed - No data to display  EKG  EKG Interpretation None       Radiology No results found.  Procedures Procedures (including critical care time)  Medications Ordered in ED Medications - No data to display   Initial Impression / Assessment and Plan / ED Course  I have reviewed the triage vital signs and the nursing notes.  Pertinent labs & imaging results that were  available during my care of the patient were reviewed by me and considered in my medical decision making (see chart for details).  Clinical Course      Patient with nonspecific rash to her bilateral forearms and neck.  No signs of infection. No vesicles or bulla. No abscess or fluctuance. No petechiae. Patient is afebrile and nontoxic appearing. Discharge with symptomatic treatment with Vistaril and hydrocortisone cream. Advised she could use Benadryl in the evening as well.. Pt advised to follow up with PCP in 2-3 days. Return precautions discussed. I advised the patient to follow-up with their primary care provider this week. I advised the patient to return to the emergency department with new or worsening symptoms or new concerns. The patient verbalized understanding and agreement with plan.        Final Clinical Impressions(s) / ED Diagnoses   Final diagnoses:  Rash and nonspecific skin eruption    New Prescriptions New Prescriptions   DIPHENHYDRAMINE (BENADRYL) 25 MG TABLET    Take 1 tablet (25 mg total) by mouth every 6 (six) hours as needed for itching (Rash).   HYDROCORTISONE 2.5 % LOTION    Apply topically  2 (two) times daily.   HYDROXYZINE (ATARAX/VISTARIL) 10 MG TABLET    Take 1 tablet (10 mg total) by mouth every 6 (six) hours as needed for itching.   I personally performed the services described in this documentation, which was scribed in my presence. The recorded information has been reviewed and is accurate.      Everlene FarrierWilliam Sabrine Patchen, PA-C 07/15/16 1916    Benjiman CoreNathan Pickering, MD 07/15/16 2322

## 2016-07-15 NOTE — ED Triage Notes (Signed)
Patient states that she has a generalized rash starting today

## 2016-07-15 NOTE — ED Notes (Signed)
Pt. Husband in lobby asking to come to back as a visitor and her 48yo son.  RN out to lobby to explain that the Pt. Is in the hall bed with a curtain and one chair for one adult visitor. Also told the husband what his wife the Pt. Said about he could stay in the lobby with the son or come back by himself. The husband was rude with his comment to RN about the Pt. Being in a hall bed and then put his hand up at the RN and stated Good Bye.   RN explained to the Pt. The interaction of her husband and the conversation and his actions so the Pt. Would be aware that RN did try to explain that the Pt. Also said the husband could come back by himself or stay out front with the 48 year old son.    The Pt. Husband also said he could not leave the 48 year old out front alone due to he was special needs.  RN said "I understand" and your wife said you can just stay out here and wait.

## 2017-11-12 ENCOUNTER — Other Ambulatory Visit: Payer: Self-pay

## 2017-11-12 ENCOUNTER — Encounter (HOSPITAL_BASED_OUTPATIENT_CLINIC_OR_DEPARTMENT_OTHER): Payer: Self-pay | Admitting: *Deleted

## 2017-11-12 ENCOUNTER — Emergency Department (HOSPITAL_BASED_OUTPATIENT_CLINIC_OR_DEPARTMENT_OTHER)
Admission: EM | Admit: 2017-11-12 | Discharge: 2017-11-12 | Disposition: A | Discharge status: Home / Self Care | Payer: Commercial Managed Care - PPO | Attending: Emergency Medicine | Admitting: Emergency Medicine

## 2017-11-12 DIAGNOSIS — R21 Rash and other nonspecific skin eruption: Secondary | ICD-10-CM | POA: Diagnosis present

## 2017-11-12 DIAGNOSIS — I10 Essential (primary) hypertension: Secondary | ICD-10-CM | POA: Diagnosis not present

## 2017-11-12 DIAGNOSIS — T7840XA Allergy, unspecified, initial encounter: Secondary | ICD-10-CM | POA: Diagnosis not present

## 2017-11-12 DIAGNOSIS — Z7982 Long term (current) use of aspirin: Secondary | ICD-10-CM | POA: Diagnosis not present

## 2017-11-12 DIAGNOSIS — Z7984 Long term (current) use of oral hypoglycemic drugs: Secondary | ICD-10-CM | POA: Insufficient documentation

## 2017-11-12 DIAGNOSIS — Z79899 Other long term (current) drug therapy: Secondary | ICD-10-CM | POA: Diagnosis not present

## 2017-11-12 MED ORDER — PREDNISONE 50 MG PO TABS
60.0000 mg | ORAL_TABLET | Freq: Once | ORAL | Status: AC
  Administered 2017-11-12: 60 mg via ORAL
  Filled 2017-11-12: qty 1

## 2017-11-12 MED ORDER — PREDNISONE 20 MG PO TABS: 40.0000 mg | ORAL_TABLET | Freq: Every day | ORAL | 0 refills | Status: DC

## 2017-11-12 NOTE — ED Provider Notes (Signed)
[facility] EMERGENCY DEPARTMENT Provider Note   CSN: [id number] Arrival date & time: 11/12/17  1630     History   Chief Complaint Chief Complaint  Patient presents with  . Insect Bite    HPI Julie [name] is a 52 y.o. female.  52 year old female with a history of hypertension, hypokalemia who is presenting today with an itchy rash that started 5 days ago.  Patient thought she may have been bit by something but did not witness being bit.  She has multiple areas over her body that are itchy and swollen.  She does recall using a new lotion but otherwise does not recall anything else new.  They have gotten no new furniture.  They do not have pets in their home.  Her husband is not having any similar symptoms.  She did not recently stay with anyone else.  She does work around various fibers at work to them.  She denies any throat swelling, shortness of breath.   The history is provided by the patient.    Past Medical History:  Diagnosis Date  . Hypertension   . Hypokalemia     Patient Active Problem List   Diagnosis Date Noted  . Sigal fracture 04/27/2011    Past Surgical History:  Procedure Laterality Date  . ENDOMETRIAL ABLATION    . TUBAL LIGATION      OB History    No data available       Home Medications    Prior to Admission medications   Medication Sig Start Date End Date Taking? Authorizing Provider  aspirin 81 MG chewable tablet Chew by mouth daily.   Yes [provider]  diphenhydrAMINE (BENADRYL) 25 MG tablet Take 1 tablet (25 mg total) by mouth every 6 (six) hours as needed for itching (Rash). 07/15/16  Yes Everlene Farrier, PA-C  ergocalciferol (VITAMIN D2) 50000 units capsule Take 50,000 Units by mouth once a week.   Yes [provider]  losartan (COZAAR) 25 MG tablet Take 25 mg by mouth daily.   Yes [provider]  metFORMIN (GLUCOPHAGE) 500 MG tablet Take 500 mg by mouth 2 (two) times daily with a meal.   Yes  [provider]  metoprolol-hydrochlorothiazide (LOPRESSOR HCT) 50-25 MG per tablet Take 1 tablet by mouth daily.     Yes [provider]  potassium chloride (K-DUR) 10 MEQ tablet Take 10 mEq by mouth daily.   Yes [provider]  ranitidine (ZANTAC) 150 MG capsule Take 150 mg by mouth every evening.   Yes [provider]  acetaminophen (TYLENOL) 500 MG tablet Take 500 mg by mouth every 6 (six) hours as needed. For pain     [provider]  diazepam (VALIUM) 5 MG tablet Take 1 tablet (5 mg total) by mouth every 8 (eight) hours as needed for muscle spasms. 05/10/16   Leta Baptist, MD  Diclofenac Sodium CR (VOLTAREN-XR) 100 MG 24 hr tablet Take 1 tablet (100 mg total) by mouth daily. 03/01/16   Palumbo, April, MD  HYDROcodone-acetaminophen (NORCO) 5-325 MG tablet Take 1-2 tablets by mouth every 6 (six) hours as needed (for pain). 09/19/15   Molpus, John, MD  hydrocortisone 2.5 % lotion Apply topically 2 (two) times daily. 07/15/16   Everlene Farrier, PA-C  hydrOXYzine (ATARAX/VISTARIL) 10 MG tablet Take 1 tablet (10 mg total) by mouth every 6 (six) hours as needed for itching. 07/15/16   Everlene Farrier, PA-C  ibuprofen (ADVIL,MOTRIN) 600 MG tablet Take 1  tablet (600 mg total) by mouth every 6 (six) hours as needed. Limit use to 5-7 days 09/24/15   Horton, Mayer Maskerourtney F, MD  lidocaine (LIDODERM) 5 % Place 1 patch onto the skin daily. Remove & Discard patch within 12 hours or as directed by MD 03/01/16   Nicanor AlconPalumbo, April, MD  loratadine (CLARITIN) 10 MG tablet Take 1 tablet (10 mg total) by mouth daily. 01/10/16   Palumbo, April, MD  meclizine (ANTIVERT) 50 MG tablet Take 1 tablet (50 mg total) by mouth 3 (three) times daily as needed. 06/21/15   Nelva NayBeaton, Robert, MD  methocarbamol (ROBAXIN) 500 MG tablet Take 1 tablet (500 mg total) by mouth 2 (two) times daily. 03/01/16   Palumbo, April, MD  naproxen (NAPROSYN) 500 MG tablet Take 1 tablet (500 mg total) by mouth 2 (two)  times daily. 05/10/16   Leta BaptistNguyen, Emily Roe, MD  predniSONE (DELTASONE) 20 MG tablet Take 2 tablets (40 mg total) by mouth daily. 11/12/17   Gwyneth SproutPlunkett, Abbegayle Denault, MD  pseudoephedrine (SUDAFED) 60 MG tablet Take 1 tablet (60 mg total) by mouth every 8 (eight) hours as needed for congestion. 08/03/15   Joycie Peekartner, Benjamin, PA-C    Family History Family History  Problem Relation Age of Onset  . Hypertension Mother   . Hypertension Father   . Diabetes Father   . Hypertension Sister   . Heart attack Neg Hx   . Sudden death Neg Hx   . Hyperlipidemia Neg Hx     Social History Social History   Tobacco Use  . Smoking status: Never Smoker  . Smokeless tobacco: Never Used  Substance Use Topics  . Alcohol use: No  . Drug use: No     Allergies   Patient has no known allergies.   Review of Systems Review of Systems  All other systems reviewed and are negative.    Physical Exam Updated Vital Signs BP (!) 140/98 (BP Location: Right Arm)   Pulse 78   Temp 98.3 F (36.8 C) (Oral)   Resp 20   Ht 5\' 5"  (1.651 m)   Wt 108.4 kg (239 lb)   SpO2 100%   BMI 39.77 kg/m   Physical Exam  Constitutional: She is oriented to person, place, and time. She appears well-developed and well-nourished. No distress.  HENT:  Head: Normocephalic and atraumatic.  Eyes: EOM are normal. Pupils are equal, round, and reactive to light.  Cardiovascular: Normal rate.  Pulmonary/Chest: Effort normal.  Musculoskeletal: She exhibits no edema or tenderness.  Neurological: She is alert and oriented to person, place, and time.  Skin: Skin is warm. Rash noted.  Patient has patchy raised papules that are excoriated with surrounding swelling diffusely over the body mostly localized over the face, upper and lower extremities but does have some on her back.  No vesicles, drainage or urticaria  Nursing note and vitals reviewed.    ED Treatments / Results  Labs (all labs ordered are listed, but only abnormal results  are displayed) Labs Reviewed - No data to display  EKG  EKG Interpretation None       Radiology No results found.  Procedures Procedures (including critical care time)  Medications Ordered in ED Medications  predniSONE (DELTASONE) tablet 60 mg (60 mg Oral Given 11/12/17 1941)     Initial Impression / Assessment and Plan / ED Course  I have reviewed the triage vital signs and the nursing notes.  Pertinent labs & imaging results that were available during my care of the  patient were reviewed by me and considered in my medical decision making (see chart for details).     Patient today with a rash that could be allergic in nature versus possible bites.  She does not have any risk factors for bedbugs and low suspicion that this is scabies.  Patient does not own animals for fleas would be unusual.  Could be allergic reaction to some new lotion she has been using.  She does have swelling around the areas and localized reaction.  Benadryl has helped some with the itching but is not helping with the swelling.  We will also give a course of prednisone.  Recommended stopping anything that is new  Final Clinical Impressions(s) / ED Diagnoses   Final diagnoses:  Allergic reaction, initial encounter    ED Discharge Orders        Ordered    predniSONE (DELTASONE) 20 MG tablet  Daily     11/12/17 1934       Gwyneth Sprout, MD 11/12/17 2112

## 2017-11-12 NOTE — ED Triage Notes (Signed)
Pt reports she thought she got bitten by something on back of her right leg on Tuesday. States area is itching. Pt now has areas on her arms, forehead and back that are swollen and itchy

## 2017-11-12 NOTE — Discharge Instructions (Signed)
Also take benadryl 2 tabs every 6 hours for itching for the next few days

## 2017-11-12 NOTE — ED Notes (Signed)
Pt given d/c instructions as per chart. Rx x 1. Verbalizes understanding. No questions. 

## 2017-12-18 ENCOUNTER — Encounter (HOSPITAL_BASED_OUTPATIENT_CLINIC_OR_DEPARTMENT_OTHER): Payer: Self-pay | Admitting: Emergency Medicine

## 2017-12-18 ENCOUNTER — Other Ambulatory Visit: Payer: Self-pay

## 2017-12-18 ENCOUNTER — Emergency Department (HOSPITAL_BASED_OUTPATIENT_CLINIC_OR_DEPARTMENT_OTHER)
Admission: EM | Admit: 2017-12-18 | Discharge: 2017-12-18 | Disposition: A | Discharge status: Home / Self Care | Payer: PRIVATE HEALTH INSURANCE | Attending: Emergency Medicine | Admitting: Emergency Medicine

## 2017-12-18 DIAGNOSIS — R51 Headache: Secondary | ICD-10-CM | POA: Insufficient documentation

## 2017-12-18 DIAGNOSIS — Z5321 Procedure and treatment not carried out due to patient leaving prior to being seen by health care provider: Secondary | ICD-10-CM | POA: Insufficient documentation

## 2017-12-18 DIAGNOSIS — R05 Cough: Secondary | ICD-10-CM | POA: Insufficient documentation

## 2017-12-18 DIAGNOSIS — R6883 Chills (without fever): Secondary | ICD-10-CM | POA: Insufficient documentation

## 2017-12-18 HISTORY — DX: Type 2 diabetes mellitus without complications: E11.9

## 2017-12-18 NOTE — ED Triage Notes (Signed)
Last night having flu like symptoms, cough, h/a and chills

## 2018-03-02 ENCOUNTER — Emergency Department (HOSPITAL_BASED_OUTPATIENT_CLINIC_OR_DEPARTMENT_OTHER): Payer: Commercial Managed Care - PPO

## 2018-03-02 ENCOUNTER — Other Ambulatory Visit: Payer: Self-pay

## 2018-03-02 ENCOUNTER — Emergency Department (HOSPITAL_BASED_OUTPATIENT_CLINIC_OR_DEPARTMENT_OTHER)
Admission: EM | Admit: 2018-03-02 | Discharge: 2018-03-02 | Disposition: A | Discharge status: Home / Self Care | Payer: Commercial Managed Care - PPO | Attending: Emergency Medicine | Admitting: Emergency Medicine

## 2018-03-02 ENCOUNTER — Encounter (HOSPITAL_BASED_OUTPATIENT_CLINIC_OR_DEPARTMENT_OTHER): Payer: Self-pay | Admitting: Emergency Medicine

## 2018-03-02 DIAGNOSIS — Z7984 Long term (current) use of oral hypoglycemic drugs: Secondary | ICD-10-CM | POA: Diagnosis not present

## 2018-03-02 DIAGNOSIS — R05 Cough: Secondary | ICD-10-CM

## 2018-03-02 DIAGNOSIS — I10 Essential (primary) hypertension: Secondary | ICD-10-CM | POA: Insufficient documentation

## 2018-03-02 DIAGNOSIS — Z79899 Other long term (current) drug therapy: Secondary | ICD-10-CM | POA: Insufficient documentation

## 2018-03-02 DIAGNOSIS — E119 Type 2 diabetes mellitus without complications: Secondary | ICD-10-CM | POA: Insufficient documentation

## 2018-03-02 DIAGNOSIS — R059 Cough, unspecified: Secondary | ICD-10-CM

## 2018-03-02 DIAGNOSIS — Z7982 Long term (current) use of aspirin: Secondary | ICD-10-CM | POA: Diagnosis not present

## 2018-03-02 MED ORDER — BENZONATATE 100 MG PO CAPS: 100.0000 mg | ORAL_CAPSULE | Freq: Three times a day (TID) | ORAL | 0 refills | Status: DC

## 2018-03-02 MED FILL — BENZONATATE 100 MG CAP: 100 | 7 days supply | Qty: 21 | Fill #0

## 2018-03-02 NOTE — ED Notes (Signed)
ED Provider at bedside. 

## 2018-03-02 NOTE — ED Triage Notes (Signed)
Cough x 1 week

## 2018-03-02 NOTE — ED Provider Notes (Signed)
[facility] EMERGENCY DEPARTMENT Provider Note  CSN: [id number] Arrival date & time: 03/02/18 7829  Chief Complaint(s) Cough  HPI Julie [name] is a 52 y.o. female   The history is provided by the patient.  Cough  This is a new problem. Episode onset: 1 week. The problem occurs hourly. The problem has not changed since onset.The cough is productive of purulent sputum. There has been no fever. Associated symptoms include chills and rhinorrhea. She has tried nothing for the symptoms. She is not a smoker. Her past medical history is significant for pneumonia (several months ago). Her past medical history does not include COPD or asthma.    Past Medical History Past Medical History:  Diagnosis Date  . Diabetes mellitus without complication (HCC)   . Hypertension   . Hypokalemia    Patient Active Problem List   Diagnosis Date Noted  . Bachmann fracture 04/27/2011   Home Medication(s) Prior to Admission medications   Medication Sig Start Date End Date Taking? Authorizing Provider  acetaminophen (TYLENOL) 500 MG tablet Take 500 mg by mouth every 6 (six) hours as needed. For pain     [provider]  aspirin 81 MG chewable tablet Chew by mouth daily.    [provider]  benzonatate (TESSALON) 100 MG capsule Take 1 capsule (100 mg total) by mouth every 8 (eight) hours. 03/02/18   Otillia Cordone, Amadeo Garnet, MD  diazepam (VALIUM) 5 MG tablet Take 1 tablet (5 mg total) by mouth every 8 (eight) hours as needed for muscle spasms. 05/10/16   Leta Baptist, MD  Diclofenac Sodium CR (VOLTAREN-XR) 100 MG 24 hr tablet Take 1 tablet (100 mg total) by mouth daily. 03/01/16   Palumbo, April, MD  diphenhydrAMINE (BENADRYL) 25 MG tablet Take 1 tablet (25 mg total) by mouth every 6 (six) hours as needed for itching (Rash). 07/15/16   Everlene Farrier, PA-C  ergocalciferol (VITAMIN D2) 50000 units capsule Take 50,000 Units by mouth once a week.    [provider]    HYDROcodone-acetaminophen (NORCO) 5-325 MG tablet Take 1-2 tablets by mouth every 6 (six) hours as needed (for pain). 09/19/15   Molpus, John, MD  hydrocortisone 2.5 % lotion Apply topically 2 (two) times daily. 07/15/16   Everlene Farrier, PA-C  hydrOXYzine (ATARAX/VISTARIL) 10 MG tablet Take 1 tablet (10 mg total) by mouth every 6 (six) hours as needed for itching. 07/15/16   Everlene Farrier, PA-C  ibuprofen (ADVIL,MOTRIN) 600 MG tablet Take 1 tablet (600 mg total) by mouth every 6 (six) hours as needed. Limit use to 5-7 days 09/24/15   Horton, Mayer Masker, MD  lidocaine (LIDODERM) 5 % Place 1 patch onto the skin daily. Remove & Discard patch within 12 hours or as directed by MD 03/01/16   Nicanor Alcon, April, MD  loratadine (CLARITIN) 10 MG tablet Take 1 tablet (10 mg total) by mouth daily. 01/10/16   Palumbo, April, MD  losartan (COZAAR) 25 MG tablet Take 25 mg by mouth daily.    [provider]  meclizine (ANTIVERT) 50 MG tablet Take 1 tablet (50 mg total) by mouth 3 (three) times daily as needed. 06/21/15   Nelva Nay, MD  metFORMIN (GLUCOPHAGE) 500 MG tablet Take 500 mg by mouth 2 (two) times daily with a meal.    [provider]  methocarbamol (ROBAXIN) 500 MG tablet Take 1 tablet (500 mg total) by mouth 2 (two) times daily. 03/01/16   Palumbo, April, MD  metoprolol-hydrochlorothiazide (LOPRESSOR HCT) 50-25 MG  per tablet Take 1 tablet by mouth daily.      [provider]  naproxen (NAPROSYN) 500 MG tablet Take 1 tablet (500 mg total) by mouth 2 (two) times daily. 05/10/16   Leta Baptist, MD  potassium chloride (K-DUR) 10 MEQ tablet Take 10 mEq by mouth daily.    [provider]  predniSONE (DELTASONE) 20 MG tablet Take 2 tablets (40 mg total) by mouth daily. 11/12/17   Gwyneth Sprout, MD  pseudoephedrine (SUDAFED) 60 MG tablet Take 1 tablet (60 mg total) by mouth every 8 (eight) hours as needed for congestion. 08/03/15   Cartner, Sharlet Salina, PA-C  ranitidine  (ZANTAC) 150 MG capsule Take 150 mg by mouth every evening.    [provider]                                                                                                                                    Past Surgical History Past Surgical History:  Procedure Laterality Date  . ENDOMETRIAL ABLATION    . TUBAL LIGATION     Family History Family History  Problem Relation Age of Onset  . Hypertension Mother   . Hypertension Father   . Diabetes Father   . Hypertension Sister   . Heart attack Neg Hx   . Sudden death Neg Hx   . Hyperlipidemia Neg Hx     Social History Social History   Tobacco Use  . Smoking status: Never Smoker  . Smokeless tobacco: Never Used  Substance Use Topics  . Alcohol use: No  . Drug use: No   Allergies Patient has no known allergies.  Review of Systems Review of Systems  Constitutional: Positive for chills.  HENT: Positive for rhinorrhea.   Respiratory: Positive for cough.    All other systems are reviewed and are negative for acute change except as noted in the HPI  Physical Exam Vital Signs  I have reviewed the triage vital signs BP (!) 153/103 (BP Location: Right Arm) Comment: has not taken BP meds  Pulse 72   Temp 98.3 F (36.8 C)   Resp 16   Ht  (1.651 m)   Wt 109.3 kg (241 lb)   SpO2 100%   BMI 40.10 kg/m   Physical Exam  Constitutional: She is oriented to person, place, and time. She appears well-developed and well-nourished. No distress.  HENT:  Head: Normocephalic and atraumatic.  Nose: Nose normal.  Eyes: Pupils are equal, round, and reactive to light. Conjunctivae and EOM are normal. Right eye exhibits no discharge. Left eye exhibits no discharge. No scleral icterus.  Neck: Normal range of motion. Neck supple.  Cardiovascular: Normal rate and regular rhythm. Exam reveals no gallop and no friction rub.  No murmur heard. Pulmonary/Chest: Effort normal and breath sounds normal. No stridor. No respiratory  distress. She has no rales.  Abdominal: Soft. She exhibits no distension. There is no tenderness.  Musculoskeletal: She exhibits no edema or tenderness.  Neurological: She is alert and oriented to person, place, and time.  Skin: Skin is warm and dry. No rash noted. She is not diaphoretic. No erythema.  Psychiatric: She has a normal mood and affect.  Vitals reviewed.   ED Results and Treatments Labs (all labs ordered are listed, but only abnormal results are displayed) Labs Reviewed - No data to display                                                                                                                       EKG  EKG Interpretation  Date/Time:    Ventricular Rate:    PR Interval:    QRS Duration:   QT Interval:    QTC Calculation:   R Axis:     Text Interpretation:        Radiology Dg Chest 2 View  Result Date: 03/02/2018 CLINICAL DATA:  Cough and fever for several days EXAM: CHEST - 2 VIEW COMPARISON:  01/20/2018 FINDINGS: The heart size and mediastinal contours are within normal limits. Both lungs are clear. The visualized skeletal structures are unremarkable. IMPRESSION: No active cardiopulmonary disease. Electronically Signed   By: Alcide Clever M.D.   On: 03/02/2018 08:47   Pertinent labs & imaging results that were available during my care of the patient were reviewed by me and considered in my medical decision making (see chart for details).  Medications Ordered in ED Medications - No data to display                                                                                                                                  Procedures Procedures  (including critical care time)  Medical Decision Making / ED Course I have reviewed the nursing notes for this encounter and the patient's prior records (if available in EHR or on provided paperwork).    Patient here with 1 week of productive cough.  She is well-appearing, well-hydrated, nontoxic.  Afebrile with  stable vital signs.  Lungs clear to auscultation.  Chest x-ray negative for pneumonia.  Likely viral process versus allergies.  The patient appears reasonably screened and/or stabilized for discharge and I doubt any other medical condition or other [facility] requiring further screening, evaluation, or treatment in the ED at this time prior to discharge.  The patient is safe for discharge with strict return precautions.   Final  Clinical Impression(s) / ED Diagnoses Final diagnoses:  Cough    Disposition: Discharge  Condition: Good  I have discussed the results, Dx and Tx plan with the patient who expressed understanding and agree(s) with the plan. Discharge instructions discussed at great length. The patient was given strict return precautions who verbalized understanding of the instructions. No further questions at time of discharge.    ED Discharge Orders        Ordered    benzonatate (TESSALON) 100 MG capsule  Every 8 hours     03/02/18 0855       Follow Up: Vista Deck, NP [phone] [address] [place] [address] [phone]  Schedule an appointment as soon as possible for a visit  in 5-7 days, If symptoms do not improve or  worsen     This chart was dictated using voice recognition software.  Despite best efforts to proofread,  errors can occur which can change the documentation meaning.   Nira Conn, MD 03/02/18 [phone]

## 2018-04-21 ENCOUNTER — Encounter (HOSPITAL_BASED_OUTPATIENT_CLINIC_OR_DEPARTMENT_OTHER): Payer: Self-pay | Admitting: *Deleted

## 2018-04-21 ENCOUNTER — Emergency Department (HOSPITAL_BASED_OUTPATIENT_CLINIC_OR_DEPARTMENT_OTHER)
Admission: EM | Admit: 2018-04-21 | Discharge: 2018-04-21 | Disposition: A | Discharge status: Home / Self Care | Payer: Commercial Managed Care - PPO | Attending: Emergency Medicine | Admitting: Emergency Medicine

## 2018-04-21 ENCOUNTER — Other Ambulatory Visit: Payer: Self-pay

## 2018-04-21 DIAGNOSIS — Z79899 Other long term (current) drug therapy: Secondary | ICD-10-CM | POA: Diagnosis not present

## 2018-04-21 DIAGNOSIS — I10 Essential (primary) hypertension: Secondary | ICD-10-CM | POA: Insufficient documentation

## 2018-04-21 DIAGNOSIS — Z7982 Long term (current) use of aspirin: Secondary | ICD-10-CM | POA: Diagnosis not present

## 2018-04-21 DIAGNOSIS — J029 Acute pharyngitis, unspecified: Secondary | ICD-10-CM | POA: Diagnosis not present

## 2018-04-21 DIAGNOSIS — Z7984 Long term (current) use of oral hypoglycemic drugs: Secondary | ICD-10-CM | POA: Diagnosis not present

## 2018-04-21 DIAGNOSIS — E119 Type 2 diabetes mellitus without complications: Secondary | ICD-10-CM | POA: Diagnosis not present

## 2018-04-21 LAB — RAPID STREP SCREEN (MED CTR MEBANE ONLY): Streptococcus, Group A Screen (Direct): NEGATIVE

## 2018-04-21 NOTE — ED Provider Notes (Signed)
[facility] EMERGENCY DEPARTMENT Provider Note   CSN: [id number] Arrival date & time: 04/21/18  1955     History   Chief Complaint Chief Complaint  Patient presents with  . Sore Throat    HPI Julie [name] is a 52 y.o. female.  The history is provided by the patient.  Sore Throat  This is a new problem. Episode onset: 4 days ago. The problem occurs constantly. The problem has been gradually worsening. Pertinent negatives include no chest pain, no abdominal pain, no headaches and no shortness of breath. The symptoms are aggravated by swallowing. Nothing relieves the symptoms. She has tried ASA for the symptoms. The treatment provided no relief.  Patient also endorses a 4 day history of rhinorrhea and unproductive mild cough. Patient states that rhinorrhea has been clear. Patient states that multiple people at her work have had similar symptoms in the past few weeks.  Past Medical History:  Diagnosis Date  . Diabetes mellitus without complication (HCC)   . Hypertension   . Hypokalemia     Patient Active Problem List   Diagnosis Date Noted  . Alvis fracture 04/27/2011    Past Surgical History:  Procedure Laterality Date  . ENDOMETRIAL ABLATION    . TUBAL LIGATION       OB History   None      Home Medications    Prior to Admission medications   Medication Sig Start Date End Date Taking? Authorizing Provider  acetaminophen (TYLENOL) 500 MG tablet Take 500 mg by mouth every 6 (six) hours as needed. For pain     [provider]  aspirin 81 MG chewable tablet Chew by mouth daily.    [provider]  benzonatate (TESSALON) 100 MG capsule Take 1 capsule (100 mg total) by mouth every 8 (eight) hours. 03/02/18   Cardama, Amadeo Garnet, MD  diazepam (VALIUM) 5 MG tablet Take 1 tablet (5 mg total) by mouth every 8 (eight) hours as needed for muscle spasms. 05/10/16   Leta Baptist, MD  Diclofenac Sodium CR (VOLTAREN-XR) 100 MG 24 hr tablet Take  1 tablet (100 mg total) by mouth daily. 03/01/16   Palumbo, April, MD  diphenhydrAMINE (BENADRYL) 25 MG tablet Take 1 tablet (25 mg total) by mouth every 6 (six) hours as needed for itching (Rash). 07/15/16   Everlene Farrier, PA-C  ergocalciferol (VITAMIN D2) 50000 units capsule Take 50,000 Units by mouth once a week.    [provider]  HYDROcodone-acetaminophen (NORCO) 5-325 MG tablet Take 1-2 tablets by mouth every 6 (six) hours as needed (for pain). 09/19/15   Molpus, John, MD  hydrocortisone 2.5 % lotion Apply topically 2 (two) times daily. 07/15/16   Everlene Farrier, PA-C  hydrOXYzine (ATARAX/VISTARIL) 10 MG tablet Take 1 tablet (10 mg total) by mouth every 6 (six) hours as needed for itching. 07/15/16   Everlene Farrier, PA-C  ibuprofen (ADVIL,MOTRIN) 600 MG tablet Take 1 tablet (600 mg total) by mouth every 6 (six) hours as needed. Limit use to 5-7 days 09/24/15   Horton, Mayer Masker, MD  lidocaine (LIDODERM) 5 % Place 1 patch onto the skin daily. Remove & Discard patch within 12 hours or as directed by MD 03/01/16   Nicanor Alcon, April, MD  loratadine (CLARITIN) 10 MG tablet Take 1 tablet (10 mg total) by mouth daily. 01/10/16   Palumbo, April, MD  losartan (COZAAR) 25 MG tablet Take 25 mg by mouth daily.    [provider]  meclizine Lezlie Octave)  50 MG tablet Take 1 tablet (50 mg total) by mouth 3 (three) times daily as needed. 06/21/15   Nelva NayBeaton, Robert, MD  metFORMIN (GLUCOPHAGE) 500 MG tablet Take 500 mg by mouth 2 (two) times daily with a meal.    [provider]  methocarbamol (ROBAXIN) 500 MG tablet Take 1 tablet (500 mg total) by mouth 2 (two) times daily. 03/01/16   Palumbo, April, MD  metoprolol-hydrochlorothiazide (LOPRESSOR HCT) 50-25 MG per tablet Take 1 tablet by mouth daily.      [provider]  naproxen (NAPROSYN) 500 MG tablet Take 1 tablet (500 mg total) by mouth 2 (two) times daily. 05/10/16   Leta BaptistNguyen, Emily Roe, MD  potassium chloride (K-DUR) 10 MEQ tablet  Take 10 mEq by mouth daily.    [provider]  predniSONE (DELTASONE) 20 MG tablet Take 2 tablets (40 mg total) by mouth daily. 11/12/17   Gwyneth SproutPlunkett, Whitney, MD  pseudoephedrine (SUDAFED) 60 MG tablet Take 1 tablet (60 mg total) by mouth every 8 (eight) hours as needed for congestion. 08/03/15   Cartner, Sharlet SalinaBenjamin, PA-C  ranitidine (ZANTAC) 150 MG capsule Take 150 mg by mouth every evening.    [provider]    Family History Family History  Problem Relation Age of Onset  . Hypertension Mother   . Hypertension Father   . Diabetes Father   . Hypertension Sister   . Heart attack Neg Hx   . Sudden death Neg Hx   . Hyperlipidemia Neg Hx     Social History Social History   Tobacco Use  . Smoking status: Never Smoker  . Smokeless tobacco: Never Used  Substance Use Topics  . Alcohol use: No  . Drug use: No     Allergies   Patient has no known allergies.   Review of Systems Review of Systems  Constitutional: Negative.  Negative for chills, fatigue and fever.  HENT: Positive for postnasal drip, rhinorrhea and sore throat. Negative for congestion, drooling, ear pain, trouble swallowing and voice change.   Eyes: Negative.  Negative for visual disturbance.  Respiratory: Positive for cough. Negative for chest tightness and shortness of breath.        Unproductive dry cough.  Cardiovascular: Negative.  Negative for chest pain and leg swelling.  Gastrointestinal: Negative.  Negative for abdominal pain, blood in stool, diarrhea, nausea and vomiting.  Genitourinary: Negative.  Negative for dysuria, flank pain and hematuria.  Musculoskeletal: Negative.  Negative for arthralgias, myalgias and neck pain.  Skin: Negative.  Negative for rash.  Neurological: Negative.  Negative for dizziness, syncope, weakness, light-headedness and headaches.     Physical Exam Updated Vital Signs BP (!) 158/91   Pulse 83   Temp 98.4 F (36.9 C) (Oral)   Resp 18   Ht 5\' 5"  (1.651 m)    Wt 111.1 kg (245 lb)   SpO2 99%   BMI 40.77 kg/m   Physical Exam  Constitutional: She is oriented to person, place, and time. She appears well-developed and well-nourished.  Non-toxic appearance. She does not appear ill. No distress.  HENT:  Head: Normocephalic and atraumatic.  Right Ear: Tympanic membrane and ear canal normal. No drainage.  Left Ear: Tympanic membrane and ear canal normal. No drainage.  Nose: Rhinorrhea present.  Mouth/Throat: Uvula is midline and mucous membranes are normal. No uvula swelling. Posterior oropharyngeal erythema present. No tonsillar abscesses.    Multiple small erythematous vesicles along the palatopharyngeal arch bilaterally consistent.  Tonsils not enlarged, without exudate. Uvula  midline, not swollen. No signs suggesting PTA or Ludwig's angina or other infections of the deep tissue of the neck. Normal Phonation Patient able to swallow without difficulty.  Eyes: Pupils are equal, round, and reactive to light.  Neck: Normal range of motion. Neck supple.  Cardiovascular: Normal rate and regular rhythm.  Pulmonary/Chest: Effort normal and breath sounds normal. No stridor. No respiratory distress. She has no wheezes. She has no rhonchi. She has no rales.  Abdominal: Soft. Bowel sounds are normal. There is no tenderness. There is no rebound and no guarding.  Neurological: She is alert and oriented to person, place, and time.  Skin: Skin is warm and dry.  Psychiatric: She has a normal mood and affect. Her behavior is normal.    ED Treatments / Results  Labs (all labs ordered are listed, but only abnormal results are displayed) Labs Reviewed  RAPID STREP SCREEN ([facility] ONLY)  CULTURE, GROUP A STREP [facility])    EKG None  Radiology No results found.  Procedures Procedures (including critical care time)  Medications Ordered in ED Medications - No data to display   Initial Impression / Assessment and Plan / ED Course  I have reviewed  the triage vital signs and the nursing notes.  Pertinent labs & imaging results that were available during my care of the patient were reviewed by me and considered in my medical decision making (see chart for details).    Pt afebrile, negative strep in department, specimen sent for culture. Patient presents with mild dysphagia; diagnosis of viral pharyngitis. No antibiotics indicated. Patient discharged with symptomatic treatment of salt-water gargles, anti-inflammatory medications.  Pt does not appear dehydrated. Presentation non concerning for PTA, retropharyngeal abscess or ludwigs angina. No trismus or uvula deviation. Return precautions discussed at length with patient. Patient states understanding of return precautions. Pt able to drink water in ED without difficulty, airway intact. Patient states that she will follow up with her primary care provider tomorrow.  Patient seen by Rutha Bouchard PA-C who agrees with plan to discharge patient.  Final Clinical Impressions(s) / ED Diagnoses   Final diagnoses:  Viral pharyngitis    ED Discharge Orders    None       Elizabeth Palau 04/22/18 0109    Gwyneth Sprout, MD 04/24/18 2025

## 2018-04-21 NOTE — ED Triage Notes (Signed)
Sore throat x 4 days. Sinus drainage.

## 2018-04-21 NOTE — ED Notes (Signed)
Pt understood dc material. NAD noted. 

## 2018-04-21 NOTE — Discharge Instructions (Addendum)
Please call your primary care provider in the morning to schedule follow-up appointment regarding your visit today. Please return to the emergency department for any new or worsening symptoms.  Contact a doctor if: You have a fever for more than 2-3 days. You keep having symptoms for more than 2-3 days. Your throat does not get better in 7 days. You have a fever and your symptoms suddenly get worse. Get help right away if: You have trouble breathing. You cannot swallow fluids, soft foods, or your saliva. You have swelling in your throat or neck that gets worse. You keep feeling like you are going to throw up (vomit). You keep throwing up. Get help if: Your symptoms last for 10 days or longer. Your symptoms get worse over time. You have a fever. You have very bad pain in your face or forehead. Parts of your jaw or neck become very swollen. Get help right away if: You feel pain or pressure in your chest. You have shortness of breath. You faint or feel like you will faint. You keep throwing up (vomiting). You feel confused.

## 2018-04-24 LAB — CULTURE, GROUP A STREP (THRC)

## 2018-11-11 ENCOUNTER — Other Ambulatory Visit: Payer: Self-pay

## 2018-11-11 ENCOUNTER — Encounter (HOSPITAL_BASED_OUTPATIENT_CLINIC_OR_DEPARTMENT_OTHER): Payer: Self-pay | Admitting: Emergency Medicine

## 2018-11-11 ENCOUNTER — Emergency Department (HOSPITAL_BASED_OUTPATIENT_CLINIC_OR_DEPARTMENT_OTHER)
Admission: EM | Admit: 2018-11-11 | Discharge: 2018-11-11 | Disposition: A | Discharge status: Home / Self Care | Payer: Commercial Managed Care - PPO | Attending: Emergency Medicine | Admitting: Emergency Medicine

## 2018-11-11 DIAGNOSIS — Z7984 Long term (current) use of oral hypoglycemic drugs: Secondary | ICD-10-CM | POA: Diagnosis not present

## 2018-11-11 DIAGNOSIS — E119 Type 2 diabetes mellitus without complications: Secondary | ICD-10-CM | POA: Insufficient documentation

## 2018-11-11 DIAGNOSIS — I10 Essential (primary) hypertension: Secondary | ICD-10-CM | POA: Diagnosis not present

## 2018-11-11 DIAGNOSIS — M25552 Pain in left hip: Secondary | ICD-10-CM | POA: Diagnosis present

## 2018-11-11 DIAGNOSIS — M5432 Sciatica, left side: Secondary | ICD-10-CM | POA: Insufficient documentation

## 2018-11-11 DIAGNOSIS — Z79899 Other long term (current) drug therapy: Secondary | ICD-10-CM | POA: Insufficient documentation

## 2018-11-11 DIAGNOSIS — M79605 Pain in left leg: Secondary | ICD-10-CM

## 2018-11-11 DIAGNOSIS — Z7982 Long term (current) use of aspirin: Secondary | ICD-10-CM | POA: Diagnosis not present

## 2018-11-11 MED ORDER — KETOROLAC TROMETHAMINE 30 MG/ML IJ SOLN
30.0000 mg | Freq: Once | INTRAMUSCULAR | Status: AC
  Administered 2018-11-11: 30 mg via INTRAMUSCULAR
  Filled 2018-11-11: qty 1

## 2018-11-11 MED ORDER — HYDROCODONE-ACETAMINOPHEN 5-325 MG PO TABS
1.0000 | ORAL_TABLET | Freq: Once | ORAL | Status: AC
  Administered 2018-11-11: 1 via ORAL
  Filled 2018-11-11: qty 1

## 2018-11-11 MED ORDER — NAPROXEN 500 MG PO TABS: 500.0000 mg | ORAL_TABLET | Freq: Two times a day (BID) | ORAL | 0 refills | Status: DC

## 2018-11-11 MED ORDER — METHOCARBAMOL 500 MG PO TABS: 500.0000 mg | ORAL_TABLET | Freq: Two times a day (BID) | ORAL | 0 refills | Status: DC

## 2018-11-11 MED ORDER — METHOCARBAMOL 500 MG PO TABS
500.0000 mg | ORAL_TABLET | Freq: Once | ORAL | Status: AC
  Administered 2018-11-11: 500 mg via ORAL
  Filled 2018-11-11: qty 1

## 2018-11-11 NOTE — ED Triage Notes (Signed)
L hip pain radiating down to her foot. Denies injury. Taking ibuprofen and tylenol without relief.

## 2018-11-11 NOTE — ED Provider Notes (Signed)
[facility] EMERGENCY DEPARTMENT Provider Note   CSN: [id number] Arrival date & time: 11/11/18  0946     History   Chief Complaint Chief Complaint  Patient presents with  . Leg Pain    HPI Julie [name] is a 53 y.o. female.  Julie [name] is a 53 y.o. female with a history of diabetes and hypertension, who presents to the emergency department for evaluation of left hip pain radiating down to her left foot.  It has been present worsening over the past 2 weeks.  She denies any injury or trauma to the hip. Pain localized to the left gluteus and radiates around to the knee. Pain is a 10/10, that is a constant dull ache. Worse with movement and palpation.  Patient denies any associated numbness or tingling radiating into the legs, no saddle anesthesia, no loss of bowel or bladder control.  No recent strenuous activity or heavy lifting.  Patient has been taking ibuprofen and Tylenol without relief.  Ambulatory but with some pain. Denies fevers or chills, no dysuria or frequency, no N/V, no abdominal pain.  No history of cancer or IV drug use.      Past Medical History:  Diagnosis Date  . Diabetes mellitus without complication (HCC)   . Hypertension   . Hypokalemia     Patient Active Problem List   Diagnosis Date Noted  . [place] fracture 04/27/2011    Past Surgical History:  Procedure Laterality Date  . ENDOMETRIAL ABLATION    . TUBAL LIGATION       OB History   No obstetric history on file.      Home Medications    Prior to Admission medications   Medication Sig Start Date End Date Taking? Authorizing Provider  acetaminophen (TYLENOL) 500 MG tablet Take 500 mg by mouth every 6 (six) hours as needed. For pain     [provider]  aspirin 81 MG chewable tablet Chew by mouth daily.    [provider]  benzonatate (TESSALON) 100 MG capsule Take 1 capsule (100 mg total) by mouth every 8 (eight) hours. 03/02/18   Cardama, Amadeo Garnet, MD    diazepam (VALIUM) 5 MG tablet Take 1 tablet (5 mg total) by mouth every 8 (eight) hours as needed for muscle spasms. 05/10/16   Leta Baptist, MD  Diclofenac Sodium CR (VOLTAREN-XR) 100 MG 24 hr tablet Take 1 tablet (100 mg total) by mouth daily. 03/01/16   Palumbo, April, MD  diphenhydrAMINE (BENADRYL) 25 MG tablet Take 1 tablet (25 mg total) by mouth every 6 (six) hours as needed for itching (Rash). 07/15/16   Everlene Farrier, PA-C  ergocalciferol (VITAMIN D2) 50000 units capsule Take 50,000 Units by mouth once a week.    [provider]  HYDROcodone-acetaminophen (NORCO) 5-325 MG tablet Take 1-2 tablets by mouth every 6 (six) hours as needed (for pain). 09/19/15   Molpus, John, MD  hydrocortisone 2.5 % lotion Apply topically 2 (two) times daily. 07/15/16   Everlene Farrier, PA-C  hydrOXYzine (ATARAX/VISTARIL) 10 MG tablet Take 1 tablet (10 mg total) by mouth every 6 (six) hours as needed for itching. 07/15/16   Everlene Farrier, PA-C  ibuprofen (ADVIL,MOTRIN) 600 MG tablet Take 1 tablet (600 mg total) by mouth every 6 (six) hours as needed. Limit use to 5-7 days 09/24/15   Horton, Mayer Masker, MD  lidocaine (LIDODERM) 5 % Place 1 patch onto the skin daily. Remove & Discard patch within 12 hours or as  directed by MD 03/01/16   Nicanor AlconPalumbo, April, MD  loratadine (CLARITIN) 10 MG tablet Take 1 tablet (10 mg total) by mouth daily. 01/10/16   Palumbo, April, MD  losartan (COZAAR) 25 MG tablet Take 25 mg by mouth daily.    [provider]  meclizine (ANTIVERT) 50 MG tablet Take 1 tablet (50 mg total) by mouth 3 (three) times daily as needed. 06/21/15   Nelva NayBeaton, Robert, MD  metFORMIN (GLUCOPHAGE) 500 MG tablet Take 500 mg by mouth 2 (two) times daily with a meal.    [provider]  methocarbamol (ROBAXIN) 500 MG tablet Take 1 tablet (500 mg total) by mouth 2 (two) times daily. 03/01/16   Palumbo, April, MD  metoprolol-hydrochlorothiazide (LOPRESSOR HCT) 50-25 MG per tablet Take 1 tablet by  mouth daily.      [provider]  naproxen (NAPROSYN) 500 MG tablet Take 1 tablet (500 mg total) by mouth 2 (two) times daily. 05/10/16   Leta BaptistNguyen, Emily Roe, MD  potassium chloride (K-DUR) 10 MEQ tablet Take 10 mEq by mouth daily.    [provider]  predniSONE (DELTASONE) 20 MG tablet Take 2 tablets (40 mg total) by mouth daily. 11/12/17   Gwyneth SproutPlunkett, Whitney, MD  pseudoephedrine (SUDAFED) 60 MG tablet Take 1 tablet (60 mg total) by mouth every 8 (eight) hours as needed for congestion. 08/03/15   Cartner, Sharlet SalinaBenjamin, PA-C  ranitidine (ZANTAC) 150 MG capsule Take 150 mg by mouth every evening.    [provider]    Family History Family History  Problem Relation Age of Onset  . Hypertension Mother   . Hypertension Father   . Diabetes Father   . Hypertension Sister   . Heart attack Neg Hx   . Sudden death Neg Hx   . Hyperlipidemia Neg Hx     Social History Social History   Tobacco Use  . Smoking status: Never Smoker  . Smokeless tobacco: Never Used  Substance Use Topics  . Alcohol use: No  . Drug use: No     Allergies   Patient has no known allergies.   Review of Systems Review of Systems  Constitutional: Negative for chills and fever.  HENT: Negative.   Respiratory: Negative for shortness of breath.   Cardiovascular: Negative for chest pain.  Gastrointestinal: Negative for abdominal pain, constipation, diarrhea, nausea and vomiting.  Genitourinary: Negative for dysuria, flank pain, frequency and hematuria.  Musculoskeletal: Positive for arthralgias, back pain and myalgias. Negative for gait problem, joint swelling and neck pain.  Skin: Negative for color change, rash and wound.  Neurological: Negative for weakness and numbness.     Physical Exam Updated Vital Signs BP (!) 167/115 (BP Location: Left Arm)   Pulse 72   Temp 97.7 F (36.5 C) (Oral)   Resp 18   Ht 5\' 4"  (1.626 m)   Wt 113.4 kg   SpO2 100%   BMI 42.91 kg/m   Physical  Exam Vitals signs and nursing note reviewed.  Constitutional:      General: She is not in acute distress.    Appearance: She is well-developed. She is not diaphoretic.  HENT:     Head: Atraumatic.  Eyes:     General:        Right eye: No discharge.        Left eye: No discharge.  Neck:     Musculoskeletal: Neck supple.  Cardiovascular:     Pulses:          Radial pulses  are 2+ on the right side and 2+ on the left side.       Dorsalis pedis pulses are 2+ on the right side and 2+ on the left side.       Posterior tibial pulses are 2+ on the right side and 2+ on the left side.  Pulmonary:     Effort: Pulmonary effort is normal. No respiratory distress.  Abdominal:     General: Abdomen is flat. Bowel sounds are normal. There is no distension.     Palpations: Abdomen is soft. There is no mass.     Tenderness: There is no abdominal tenderness. There is no guarding.     Comments: Abdomen soft, nondistended, nontender to palpation in all quadrants without guarding or peritoneal signs, no CVA tenderness bilaterally  Musculoskeletal:     Right lower leg: No edema.     Left lower leg: No edema.     Comments: Tenderness to palpation over left gluteus and left posterior leg.  Pain made worse with range of motion of the lower extremities, negative straight leg raise Bilateral lower extremities with normal range of motion, 2+ DP and PT pulses, normal strength and sensation  Skin:    General: Skin is warm and dry.     Capillary Refill: Capillary refill takes less than 2 seconds.  Neurological:     Mental Status: She is alert and oriented to person, place, and time.     Comments: Alert, clear speech, following commands. Moving all extremities without difficulty. Bilateral lower extremities with 5/5 strength in proximal and distal muscle groups and with dorsi and plantar flexion. Sensation intact in bilateral lower extremities. 2+ patellar DTRs bilaterally. Ambulatory with steady gait   Psychiatric:        Behavior: Behavior normal.      ED Treatments / Results  Labs (all labs ordered are listed, but only abnormal results are displayed) Labs Reviewed - No data to display  EKG None  Radiology No results found.  Procedures Procedures (including critical care time)  Medications Ordered in ED Medications  HYDROcodone-acetaminophen (NORCO/VICODIN) 5-325 MG per tablet 1 tablet (has no administration in time range)  ketorolac (TORADOL) 30 MG/ML injection 30 mg (has no administration in time range)  methocarbamol (ROBAXIN) tablet 500 mg (has no administration in time range)     Initial Impression / Assessment and Plan / ED Course  I have reviewed the triage vital signs and the nursing notes.  Pertinent labs & imaging results that were available during my care of the patient were reviewed by me and considered in my medical decision making (see chart for details).  Patient presents with pain in her left hip and buttock radiating into the left leg.  Symptoms present over the last 2 weeks but not improving with anti-inflammatories.  No numbness, weakness, loss of bowel or bladder control or saddle anesthesia, no concern for cauda equina.  Patient does not have any edema and is low risk for DVT, no associated chest pain or shortness of breath.  Description of pain concerning for sciatic inflammation.  Differential also includes trochanteric bursitis, no overlying skin changes, no concern for septic arthritis.  Patient is neurovascularly intact and ambulatory.  No trauma or injury, do not think that imaging is indicated at this time.  Will treat with dose of pain medication, anti-inflammatory and muscle relaxer and have patient follow-up with her PCP and/or sports medicine.  Pain improved with treatment here in the emergency department.  Discharged home with  NSAIDs and Robaxin, encouraged to use over-the-counter muscle rubs or lidocaine patches.  Return precautions discussed.   Patient expresses understanding and agreement with plan.  Final Clinical Impressions(s) / ED Diagnoses   Final diagnoses:  Left leg pain  Sciatica of left side    ED Discharge Orders         Ordered    naproxen (NAPROSYN) 500 MG tablet  2 times daily     11/11/18 1128    methocarbamol (ROBAXIN) 500 MG tablet  2 times daily     11/11/18 1128           Jodi GeraldsFord, Lodema Parma MundayN, [place] JerseyPA-C 11/11/18 1135    Charlynne PanderYao, David Hsienta, MD 11/11/18 1459

## 2018-11-11 NOTE — Discharge Instructions (Signed)
I think that your pain is likely due to sciatic nerve inflammation.  Take anti-inflammatories and muscle relaxers as prescribed.  Muscle relaxers can cause drowsiness do not take before driving.  You can also use over-the-counter salon pas to cane patches.  Ice and heat have also provided some exercises for you to do.  If symptoms are not improving please follow-up with your primary care doctor or Dr. Pearletha Forge.  If you develop numbness weakness or tingling in your leg, are unable to walk have loss of bowel or bladder control or any other new or concerning symptoms return for reevaluation.

## 2018-11-11 NOTE — ED Notes (Signed)
ED Provider at bedside. 

## 2018-11-15 ENCOUNTER — Emergency Department (HOSPITAL_BASED_OUTPATIENT_CLINIC_OR_DEPARTMENT_OTHER): Payer: Commercial Managed Care - PPO

## 2018-11-15 ENCOUNTER — Encounter (HOSPITAL_BASED_OUTPATIENT_CLINIC_OR_DEPARTMENT_OTHER): Payer: Self-pay

## 2018-11-15 ENCOUNTER — Other Ambulatory Visit: Payer: Self-pay

## 2018-11-15 ENCOUNTER — Emergency Department (HOSPITAL_BASED_OUTPATIENT_CLINIC_OR_DEPARTMENT_OTHER)
Admission: EM | Admit: 2018-11-15 | Discharge: 2018-11-15 | Disposition: A | Discharge status: Home / Self Care | Payer: Commercial Managed Care - PPO | Attending: Emergency Medicine | Admitting: Emergency Medicine

## 2018-11-15 DIAGNOSIS — I10 Essential (primary) hypertension: Secondary | ICD-10-CM | POA: Insufficient documentation

## 2018-11-15 DIAGNOSIS — E119 Type 2 diabetes mellitus without complications: Secondary | ICD-10-CM | POA: Insufficient documentation

## 2018-11-15 DIAGNOSIS — Z7982 Long term (current) use of aspirin: Secondary | ICD-10-CM | POA: Diagnosis not present

## 2018-11-15 DIAGNOSIS — Z79899 Other long term (current) drug therapy: Secondary | ICD-10-CM | POA: Diagnosis not present

## 2018-11-15 DIAGNOSIS — J111 Influenza due to unidentified influenza virus with other respiratory manifestations: Secondary | ICD-10-CM | POA: Diagnosis not present

## 2018-11-15 DIAGNOSIS — R69 Illness, unspecified: Secondary | ICD-10-CM

## 2018-11-15 DIAGNOSIS — Z7984 Long term (current) use of oral hypoglycemic drugs: Secondary | ICD-10-CM | POA: Insufficient documentation

## 2018-11-15 DIAGNOSIS — R05 Cough: Secondary | ICD-10-CM | POA: Diagnosis present

## 2018-11-15 MED ORDER — ALBUTEROL SULFATE HFA 108 (90 BASE) MCG/ACT IN AERS
2.0000 | INHALATION_SPRAY | Freq: Once | RESPIRATORY_TRACT | Status: AC
  Administered 2018-11-15: 2 via RESPIRATORY_TRACT
  Filled 2018-11-15: qty 6.7

## 2018-11-15 MED ORDER — IPRATROPIUM-ALBUTEROL 0.5-2.5 (3) MG/3ML IN SOLN
3.0000 mL | Freq: Once | RESPIRATORY_TRACT | Status: AC
  Administered 2018-11-15: 3 mL via RESPIRATORY_TRACT
  Filled 2018-11-15: qty 3

## 2018-11-15 MED ORDER — ONDANSETRON 4 MG PO TBDP
4.0000 mg | ORAL_TABLET | Freq: Once | ORAL | Status: AC
  Administered 2018-11-15: 4 mg via ORAL
  Filled 2018-11-15: qty 1

## 2018-11-15 MED ORDER — BENZONATATE 100 MG PO CAPS: 100.0000 mg | ORAL_CAPSULE | Freq: Three times a day (TID) | ORAL | 0 refills | Status: DC

## 2018-11-15 MED ORDER — ONDANSETRON 4 MG PO TBDP: ORAL_TABLET | ORAL | 0 refills | Status: DC

## 2018-11-15 MED FILL — ONDANSETRON ODT 4 MG TABLET: 4 | 2 days supply | Qty: 9 | Fill #0

## 2018-11-15 MED FILL — BENZONATATE 100 MG CAP: 100 | 7 days supply | Qty: 21 | Fill #0

## 2018-11-15 NOTE — Discharge Instructions (Signed)
You have the flu this is a viral infection that will likely start to improve after 5-7 days, antibiotics are not helpful in treating viral infections. You may use Zofran as needed for nausea. Since your symptoms have been present for more than 2 days Tamiflu will give no additional benefit.  Please make sure you are drinking plenty of fluids. You can treat your symptoms supportively with tylenol/ibuprofen for fevers and pains, Zyrtec and Flonase to heal with nasal congestion, and over the counter cough syrups and throat lozenges to help with cough. Use inhaler as needed for SOB or cough. If your symptoms are not improving please follow up with you Primary doctor.   If you develop persistent fevers, shortness of breath or difficulty breathing, chest pain, severe headache and neck pain, persistent nausea and vomiting or other new or concerning symptoms return to the Emergency department.

## 2018-11-15 NOTE — ED Provider Notes (Signed)
[facility] EMERGENCY DEPARTMENT Provider Note   CSN: [id number] Arrival date & time: 11/15/18  1227     History   Chief Complaint Chief Complaint  Patient presents with  . Cough    HPI Julie [name] is a 53 y.o. female.  Julie [name] is a 53 y.o. female with a history of diabetes, hypertension and hypokalemia, who presents to the emergency department for evaluation of 3 days of cough.  She reports that she started coughing and cough has occasionally been productive of clear mucus.  2 days ago she started having chills, generalized body aches, rhinorrhea, sneezing and sore throat.  She also reports some nausea and decreased appetite but no vomiting or abdominal pain.  She has not had any chest pain or shortness of breath but does report some occasional chest tightness and reports she often requires an inhaler when she is sick.  Patient was recently seen in the emergency department for leg and hip pain and reports this is improving but patient certainly could have been exposed to flu or other upper respiratory virus during this hospital visit.  She has not taken anything to treat her symptoms prior to arrival and denies any other aggravating or alleviating factors.  She does report getting her flu shot this year.     Past Medical History:  Diagnosis Date  . Diabetes mellitus without complication (HCC)   . Hypertension   . Hypokalemia     Patient Active Problem List   Diagnosis Date Noted  . [place] fracture 04/27/2011    Past Surgical History:  Procedure Laterality Date  . ENDOMETRIAL ABLATION    . TUBAL LIGATION       OB History   No obstetric history on file.      Home Medications    Prior to Admission medications   Medication Sig Start Date End Date Taking? Authorizing Provider  acetaminophen (TYLENOL) 500 MG tablet Take 500 mg by mouth every 6 (six) hours as needed. For pain     [provider]  aspirin 81 MG chewable tablet Chew by mouth  daily.    [provider]  benzonatate (TESSALON) 100 MG capsule Take 1 capsule (100 mg total) by mouth every 8 (eight) hours. 11/15/18   Dartha Lodge, PA-C  diazepam (VALIUM) 5 MG tablet Take 1 tablet (5 mg total) by mouth every 8 (eight) hours as needed for muscle spasms. 05/10/16   Leta Baptist, MD  Diclofenac Sodium CR (VOLTAREN-XR) 100 MG 24 hr tablet Take 1 tablet (100 mg total) by mouth daily. 03/01/16   Palumbo, April, MD  diphenhydrAMINE (BENADRYL) 25 MG tablet Take 1 tablet (25 mg total) by mouth every 6 (six) hours as needed for itching (Rash). 07/15/16   Everlene Farrier, PA-C  ergocalciferol (VITAMIN D2) 50000 units capsule Take 50,000 Units by mouth once a week.    [provider]  HYDROcodone-acetaminophen (NORCO) 5-325 MG tablet Take 1-2 tablets by mouth every 6 (six) hours as needed (for pain). 09/19/15   Molpus, John, MD  hydrocortisone 2.5 % lotion Apply topically 2 (two) times daily. 07/15/16   Everlene Farrier, PA-C  hydrOXYzine (ATARAX/VISTARIL) 10 MG tablet Take 1 tablet (10 mg total) by mouth every 6 (six) hours as needed for itching. 07/15/16   Everlene Farrier, PA-C  ibuprofen (ADVIL,MOTRIN) 600 MG tablet Take 1 tablet (600 mg total) by mouth every 6 (six) hours as needed. Limit use to 5-7 days 09/24/15   Horton, Mayer Masker,  MD  lidocaine (LIDODERM) 5 % Place 1 patch onto the skin daily. Remove & Discard patch within 12 hours or as directed by MD 03/01/16   Nicanor AlconPalumbo, April, MD  loratadine (CLARITIN) 10 MG tablet Take 1 tablet (10 mg total) by mouth daily. 01/10/16   Palumbo, April, MD  losartan (COZAAR) 25 MG tablet Take 25 mg by mouth daily.    [provider]  meclizine (ANTIVERT) 50 MG tablet Take 1 tablet (50 mg total) by mouth 3 (three) times daily as needed. 06/21/15   Nelva NayBeaton, Robert, MD  metFORMIN (GLUCOPHAGE) 500 MG tablet Take 500 mg by mouth 2 (two) times daily with a meal.    [provider]  methocarbamol (ROBAXIN) 500 MG tablet Take 1  tablet (500 mg total) by mouth 2 (two) times daily. 11/11/18   Dartha LodgeFord, Supreme Rybarczyk N, PA-C  metoprolol-hydrochlorothiazide (LOPRESSOR HCT) 50-25 MG per tablet Take 1 tablet by mouth daily.      [provider]  naproxen (NAPROSYN) 500 MG tablet Take 1 tablet (500 mg total) by mouth 2 (two) times daily. 11/11/18   Dartha LodgeFord, Emmaclaire Switala N, PA-C  ondansetron (ZOFRAN ODT) 4 MG disintegrating tablet 4mg  ODT q4 hours prn nausea/vomit 11/15/18   Dartha LodgeFord, Jacqui Headen N, PA-C  potassium chloride (K-DUR) 10 MEQ tablet Take 10 mEq by mouth daily.    [provider]  predniSONE (DELTASONE) 20 MG tablet Take 2 tablets (40 mg total) by mouth daily. 11/12/17   Gwyneth SproutPlunkett, Whitney, MD  pseudoephedrine (SUDAFED) 60 MG tablet Take 1 tablet (60 mg total) by mouth every 8 (eight) hours as needed for congestion. 08/03/15   Cartner, Sharlet SalinaBenjamin, PA-C  ranitidine (ZANTAC) 150 MG capsule Take 150 mg by mouth every evening.    [provider]    Family History Family History  Problem Relation Age of Onset  . Hypertension Mother   . Hypertension Father   . Diabetes Father   . Hypertension Sister   . Heart attack Neg Hx   . Sudden death Neg Hx   . Hyperlipidemia Neg Hx     Social History Social History   Tobacco Use  . Smoking status: Never Smoker  . Smokeless tobacco: Never Used  Substance Use Topics  . Alcohol use: No  . Drug use: No     Allergies   Patient has no known allergies.   Review of Systems Review of Systems  Constitutional: Positive for chills. Negative for fever.  HENT: Positive for congestion, postnasal drip, rhinorrhea and sore throat. Negative for ear discharge and ear pain.   Respiratory: Positive for cough. Negative for shortness of breath.   Cardiovascular: Negative for chest pain.  Gastrointestinal: Positive for nausea. Negative for abdominal pain, diarrhea and vomiting.  Genitourinary: Negative for dysuria and frequency.  Musculoskeletal: Positive for myalgias. Negative for  arthralgias, neck pain and neck stiffness.  Skin: Negative for color change and rash.  Neurological: Negative for dizziness, syncope, light-headedness and headaches.     Physical Exam Updated Vital Signs BP (!) 151/92   Pulse 84   Temp 99.7 F (37.6 C) (Oral)   Resp 18   Ht 5\' 4"  (1.626 m)   Wt 112 kg   SpO2 100%   BMI 42.40 kg/m   Physical Exam Vitals signs and nursing note reviewed.  Constitutional:      General: She is not in acute distress.    Appearance: Normal appearance. She is well-developed. She is not ill-appearing, toxic-appearing or diaphoretic.  HENT:  Head: Normocephalic and atraumatic.     Right Ear: Tympanic membrane and ear canal normal.     Left Ear: Tympanic membrane and ear canal normal.     Nose: Congestion and rhinorrhea present.     Comments: Bilateral nares erythematous with moderate mucosal edema and clear rhinorrhea    Mouth/Throat:     Mouth: Mucous membranes are moist.     Pharynx: Oropharynx is clear. Posterior oropharyngeal erythema present. No oropharyngeal exudate.     Comments: Posterior oropharynx mildly erythematous but there is no edema or tonsillar exudates, uvula midline, normal phonation, tolerating secretions without difficulty, no trismus. Eyes:     General:        Right eye: No discharge.        Left eye: No discharge.  Neck:     Musculoskeletal: Neck supple.     Comments: No rigidity Cardiovascular:     Rate and Rhythm: Normal rate and regular rhythm.     Heart sounds: Normal heart sounds.  Pulmonary:     Effort: Pulmonary effort is normal. No respiratory distress.     Breath sounds: Normal breath sounds.     Comments: Respirations equal and unlabored, patient able to speak in full sentences, lungs clear to auscultation bilaterally Abdominal:     General: Bowel sounds are normal. There is no distension.     Palpations: Abdomen is soft. There is no mass.     Tenderness: There is no abdominal tenderness. There is no  guarding.     Comments: Abdomen soft, nondistended, nontender to palpation in all quadrants without guarding or peritoneal signs  Musculoskeletal:        General: No deformity.  Lymphadenopathy:     Cervical: No cervical adenopathy.  Skin:    General: Skin is warm and dry.     Capillary Refill: Capillary refill takes less than 2 seconds.  Neurological:     Mental Status: She is alert and oriented to person, place, and time.  Psychiatric:        Mood and Affect: Mood normal.        Behavior: Behavior normal.      ED Treatments / Results  Labs (all labs ordered are listed, but only abnormal results are displayed) Labs Reviewed - No data to display  EKG None  Radiology Dg Chest 2 View  Result Date: 11/15/2018 CLINICAL DATA:  53 y/o  F; cough, body aches, headache. EXAM: CHEST - 2 VIEW COMPARISON:  03/02/2018 chest radiograph FINDINGS: Stable heart size and mediastinal contours are within normal limits. Both lungs are clear. The visualized skeletal structures are unremarkable. IMPRESSION: No active cardiopulmonary disease. Electronically Signed   By: Mitzi Hansen M.D.   On: 11/15/2018 14:28    Procedures Procedures (including critical care time)  Medications Ordered in ED Medications  ipratropium-albuterol (DUONEB) 0.5-2.5 (3) MG/3ML nebulizer solution 3 mL (3 mLs Nebulization Given 11/15/18 1434)  ondansetron (ZOFRAN-ODT) disintegrating tablet 4 mg (4 mg Oral Given 11/15/18 1429)  albuterol (PROVENTIL HFA;VENTOLIN HFA) 108 (90 Base) MCG/ACT inhaler 2 puff (2 puffs Inhalation Given 11/15/18 1545)     Initial Impression / Assessment and Plan / ED Course  I have reviewed the triage vital signs and the nursing notes.  Pertinent labs & imaging results that were available during my care of the patient were reviewed by me and considered in my medical decision making (see chart for details).  Patient with symptoms consistent with influenza.  Vitals are stable, low-grade  fever. Improved  with medication.  No signs of dehydration, tolerating PO's.  Lungs are clear, patient did sound a little bit tight on exam but this improved with nebulizer treatment patient was provided with albuterol inhaler. CXR without signs of pneumonia, or other active cardiopulmonary disease. The patient understands that symptoms are greater than the recommended 24-48 hour window of treatment, no tamiflu provided.  Patient will be discharged with instructions to orally hydrate, rest, and use over-the-counter medications such as anti-inflammatories ibuprofen and Aleve for muscle aches and Tylenol for fever.  Patient will also be given a cough suppressant and Zofran.  Patient follow-up with PCP.  Return precautions discussed.  Patient expresses understanding and agreement with plan.  Discharged home in good condition.   Final Clinical Impressions(s) / ED Diagnoses   Final diagnoses:  Influenza-like illness    ED Discharge Orders         Ordered    ondansetron (ZOFRAN ODT) 4 MG disintegrating tablet     11/15/18 1534    benzonatate (TESSALON) 100 MG capsule  Every 8 hours     11/15/18 1534           Legrand RamsFord, Kennice Finnie N, PA-C 11/15/18 1955    Little, Ambrose Finlandachel Morgan, MD 11/18/18 1556

## 2018-11-15 NOTE — ED Notes (Signed)
NAD at this time. Pt is stable and going home.  

## 2018-11-15 NOTE — ED Triage Notes (Signed)
C/o flu like sx day 2-NAD-steady gait 

## 2019-09-12 ENCOUNTER — Encounter (HOSPITAL_BASED_OUTPATIENT_CLINIC_OR_DEPARTMENT_OTHER): Payer: Self-pay | Admitting: Emergency Medicine

## 2019-09-12 ENCOUNTER — Other Ambulatory Visit: Payer: Self-pay

## 2019-09-12 ENCOUNTER — Emergency Department (HOSPITAL_BASED_OUTPATIENT_CLINIC_OR_DEPARTMENT_OTHER)
Admission: EM | Admit: 2019-09-12 | Discharge: 2019-09-12 | Disposition: A | Discharge status: Home / Self Care | Payer: Commercial Managed Care - PPO | Attending: Emergency Medicine | Admitting: Emergency Medicine

## 2019-09-12 DIAGNOSIS — R05 Cough: Secondary | ICD-10-CM | POA: Diagnosis present

## 2019-09-12 DIAGNOSIS — E119 Type 2 diabetes mellitus without complications: Secondary | ICD-10-CM | POA: Insufficient documentation

## 2019-09-12 DIAGNOSIS — I10 Essential (primary) hypertension: Secondary | ICD-10-CM | POA: Insufficient documentation

## 2019-09-12 DIAGNOSIS — B349 Viral infection, unspecified: Secondary | ICD-10-CM | POA: Diagnosis not present

## 2019-09-12 DIAGNOSIS — Z20828 Contact with and (suspected) exposure to other viral communicable diseases: Secondary | ICD-10-CM | POA: Insufficient documentation

## 2019-09-12 HISTORY — DX: Obesity, unspecified: E66.9

## 2019-09-12 NOTE — ED Notes (Signed)
ED Provider at bedside. 

## 2019-09-12 NOTE — ED Provider Notes (Signed)
[name] Provider Note MRN:  [id number]  Arrival date & time: 09/12/19     Chief Complaint   Cough   History of Present Illness   [name] is a 53 y.o. year-old female with a history of diabetes presenting to the ED with chief complaint of cough.  1 day of cough, nasal congestion.  Multiple sick contacts at home.  Explains that she got the flu shot and started having the symptoms.  Denies fever.  No chest pain or shortness of breath.  No leg pain or swelling.  Symptoms mild, no exacerbating relieving factors.  Review of Systems  A complete 10 system review of systems was obtained and all systems are negative except as noted in the HPI and PMH.   Patient's Health History    Past Medical History:  Diagnosis Date  . Diabetes mellitus without complication ([place])   . Hypertension   . Hypokalemia   . Obesity     Past Surgical History:  Procedure Laterality Date  . ENDOMETRIAL ABLATION    . TUBAL LIGATION      Family History  Problem Relation Age of Onset  . Hypertension Mother   . Hypertension Father   . Diabetes Father   . Hypertension Sister   . Heart attack Neg Hx   . Sudden death Neg Hx   . Hyperlipidemia Neg Hx     Social History   Socioeconomic History  . Marital status: Married    Spouse name: Not on file  . Number of children: Not on file  . Years of education: Not on file  . Highest education level: Not on file  Occupational History  . Not on file  Social Needs  . Financial resource strain: Not on file  . Food insecurity    Worry: Not on file    Inability: Not on file  . Transportation needs    Medical: Not on file    Non-medical: Not on file  Tobacco Use  . Smoking status: Never Smoker  . Smokeless tobacco: Never Used  Substance and Sexual Activity  . Alcohol use: No  . Drug use: No  . Sexual activity: Not on file  Lifestyle  . Physical activity    Days per week: Not on file    Minutes  per session: Not on file  . Stress: Not on file  Relationships  . Social Herbalist on phone: Not on file    Gets together: Not on file    Attends religious service: Not on file    Active member of club or organization: Not on file    Attends meetings of clubs or organizations: Not on file    Relationship status: Not on file  . Intimate partner violence    Fear of current or ex partner: Not on file    Emotionally abused: Not on file    Physically abused: Not on file    Forced sexual activity: Not on file  Other Topics Concern  . Not on file  Social History Narrative  . Not on file     Physical Exam  Vital Signs and Nursing Notes reviewed Vitals:   09/12/19 0757  BP: (!) 154/90  Pulse: 72  Resp: 16  Temp: 97.7 F (36.5 C)  SpO2: 100%    CONSTITUTIONAL: Well-appearing, NAD NEURO:  Alert and oriented x 3, no focal deficits EYES:  eyes equal and reactive ENT/NECK:  no LAD, no JVD  CARDIO: Regular rate, well-perfused, normal S1 and S2 PULM:  CTAB no wheezing or rhonchi GI/GU:  normal bowel sounds, non-distended, non-tender MSK/SPINE:  No gross deformities, no edema SKIN:  no rash, atraumatic PSYCH:  Appropriate speech and behavior  Diagnostic and Interventional Summary    EKG Interpretation  Date/Time:    Ventricular Rate:    PR Interval:    QRS Duration:   QT Interval:    QTC Calculation:   R Axis:     Text Interpretation:        Labs Reviewed  SARS CORONAVIRUS 2 (TAT 6-24 HRS)    No orders to display    Medications - No data to display   Procedures  /  Critical Care Procedures  ED Course and Medical Decision Making  I have reviewed the triage vital signs and the nursing notes.  Pertinent labs & imaging results that were available during my care of the patient were reviewed by me and considered in my medical decision making (see below for details).     Consistent with URI, coronavirus is possible, swab here in the emergency department,  normal vital signs, no increased work of breathing, clear lungs, no indication for imaging, appropriate for discharge.    [name]. Pilar Plate, MD [name] Emergency [facility] [email or web address]  Final Clinical Impressions(s) / ED Diagnoses     ICD-10-CM   1. Viral illness  B34.9     ED Discharge Orders    None       Discharge Instructions Discussed with and Provided to Patient:     Discharge Instructions     You were evaluated in the Emergency Department and after careful evaluation, we did not find any emergent condition requiring admission or further testing in the [name].  Your exam/testing today was overall reassuring.  Your symptoms seem to be due to a virus, possibly coronavirus.  We have tested you for the coronavirus here in the Emergency Department.  Please isolate or quarantine at home until you receive a negative test result.  If positive, we recommend continued home quarantine per [facility] recommendations.   Please return to the Emergency Department if you experience any worsening of your condition.  We encourage you to follow up with a primary care provider.  Thank you for allowing [place] to be a part of your care.        Julie Sous, MD 09/12/19 [phone]

## 2019-09-12 NOTE — Discharge Instructions (Signed)
You were evaluated in the Emergency Department and after careful evaluation, we did not find any emergent condition requiring admission or further testing in the hospital.  Your exam/testing today was overall reassuring.  Your symptoms seem to be due to a virus, possibly coronavirus.  We have tested you for the coronavirus here in the Emergency Department.  Please isolate or quarantine at home until you receive a negative test result.  If positive, we recommend continued home quarantine per [facility] recommendations.   Please return to the Emergency Department if you experience any worsening of your condition.  We encourage you to follow up with a primary care provider.  Thank you for allowing [place] to be a part of your care.

## 2019-09-12 NOTE — ED Triage Notes (Signed)
Cough and sinus congestion for a few days.

## 2019-09-13 LAB — NOVEL CORONAVIRUS, NAA (HOSP ORDER, SEND-OUT TO REF LAB; TAT 18-24 HRS): SARS-CoV-2, NAA: NOT DETECTED

## 2020-01-24 IMAGING — CR DG CHEST 2V
2 series · 2 of 2 positions shown · non-contrast
Comparison: 01/20/2018

CLINICAL DATA: Cough and fever for several days

EXAM:
CHEST - 2 VIEW

[w chest pa]
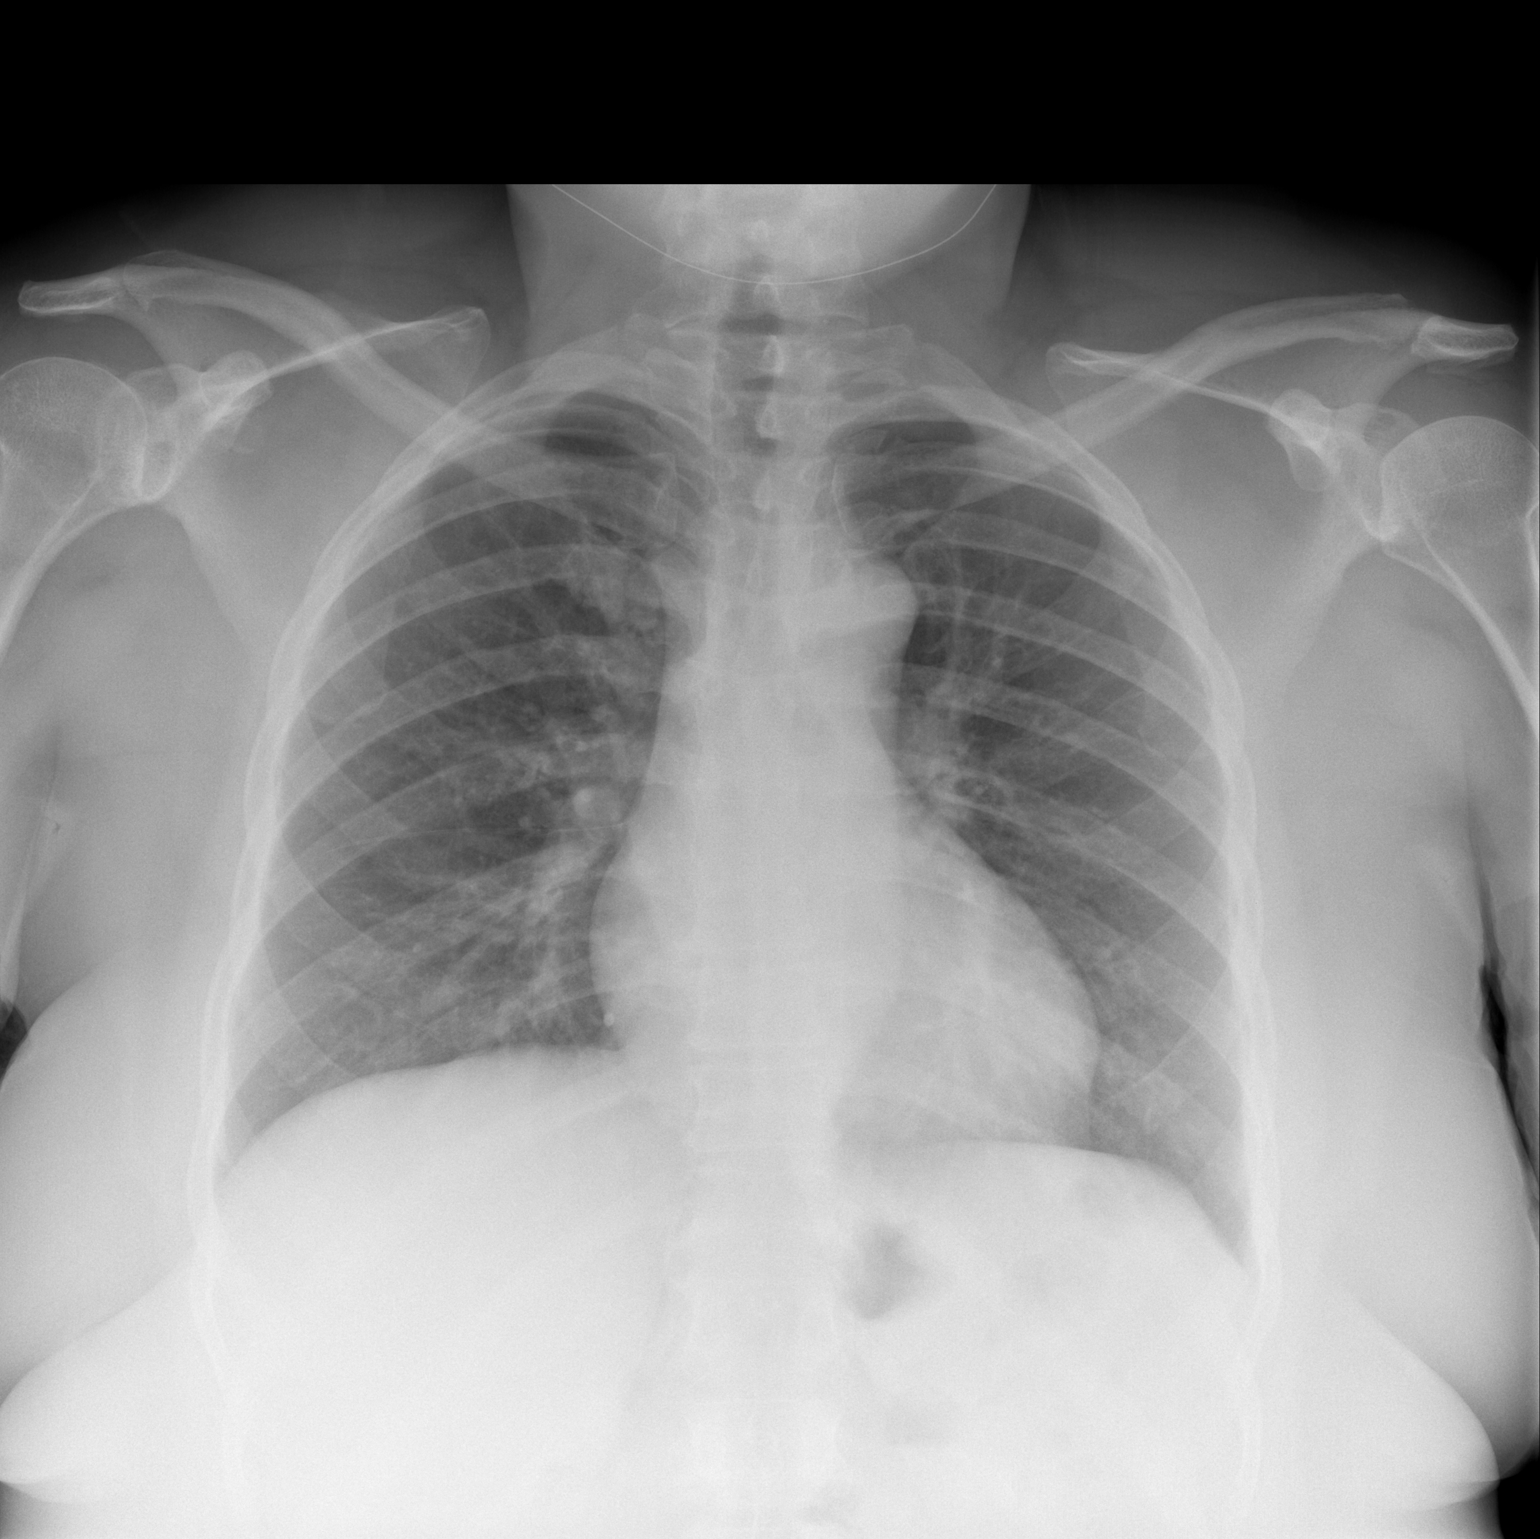

[w chest lat]
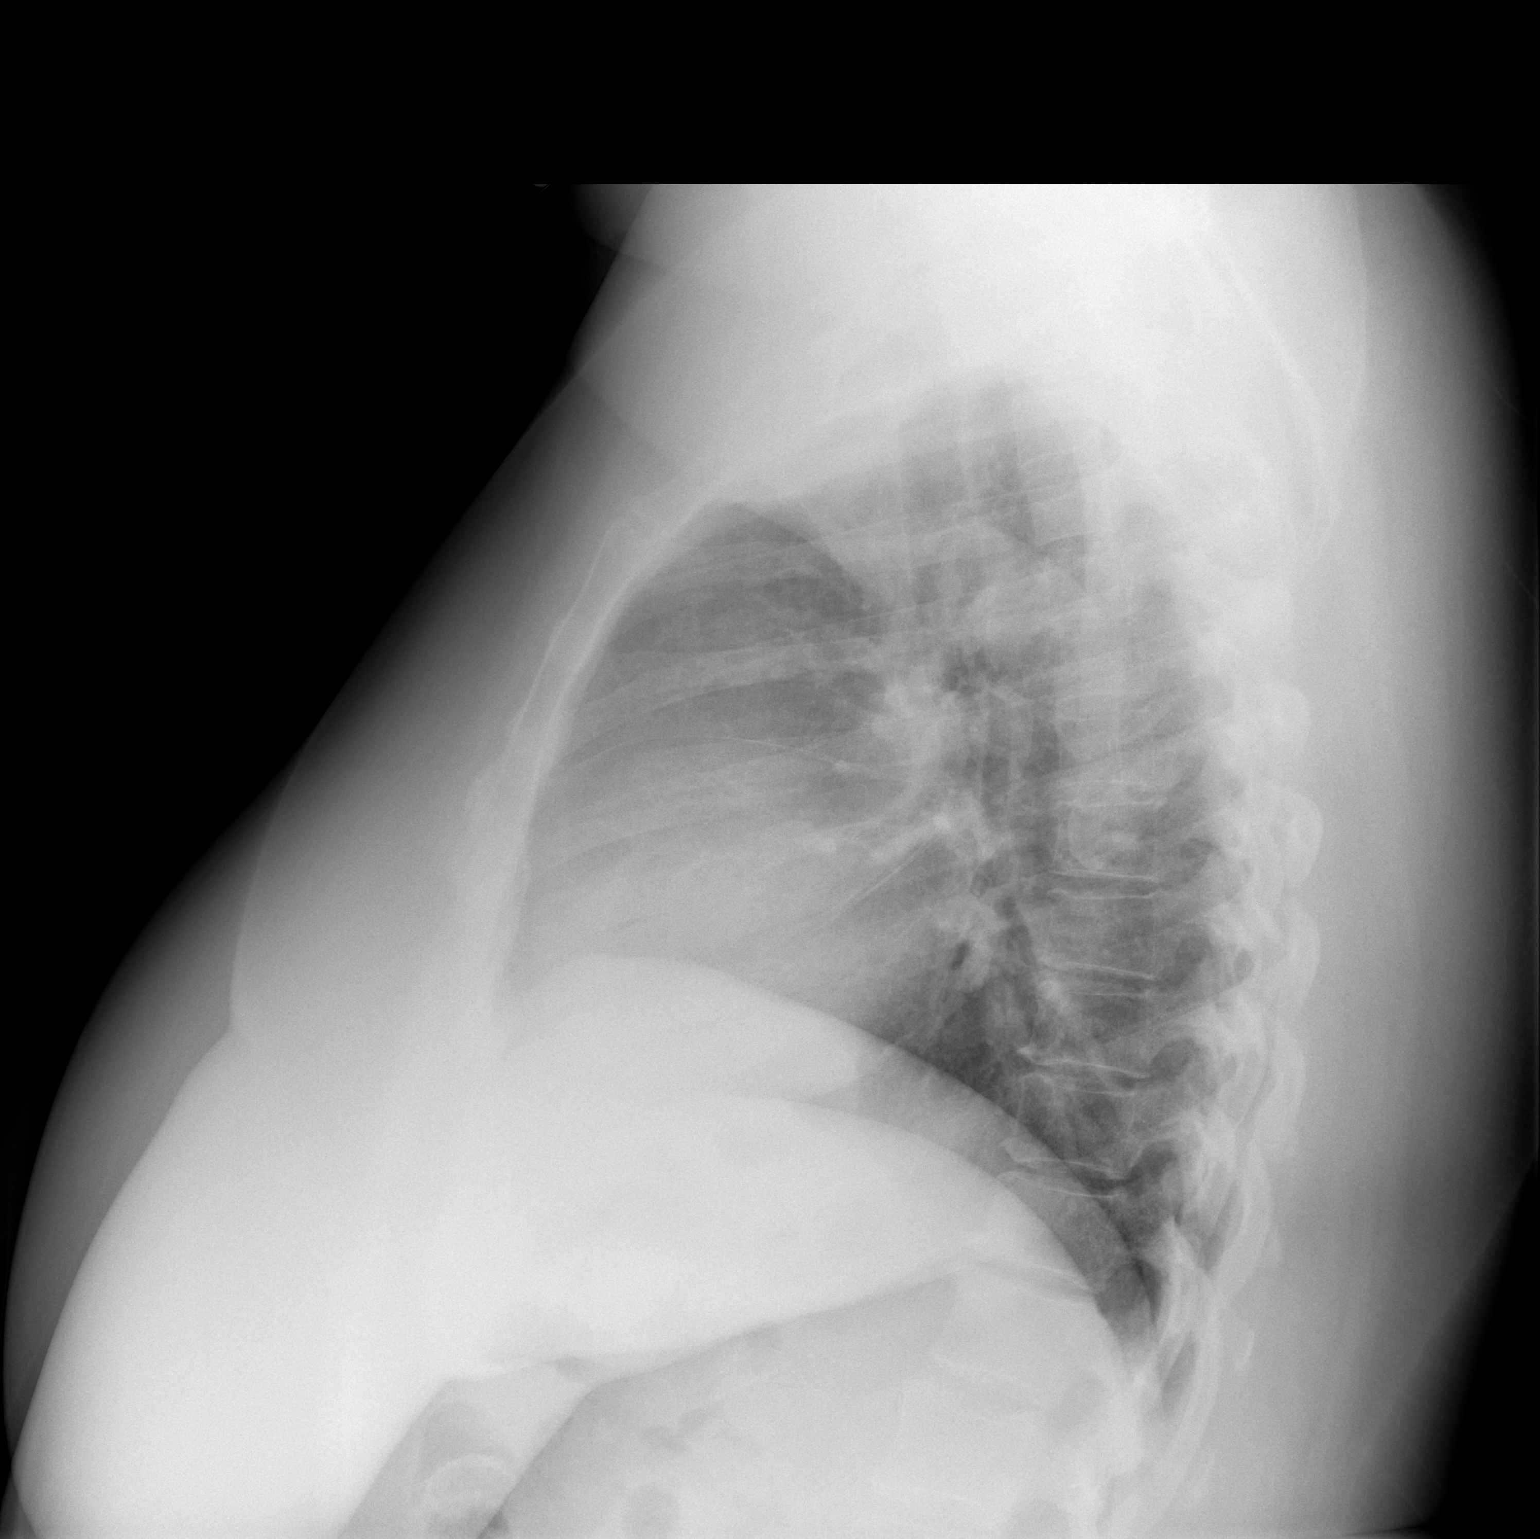

[2 of 2 positions shown; findings below may reference images not displayed]

FINDINGS: The heart size and mediastinal contours are within normal limits.
Both lungs are clear. The visualized skeletal structures are
unremarkable.
IMPRESSION: No active cardiopulmonary disease.

## 2020-02-14 ENCOUNTER — Other Ambulatory Visit: Payer: Self-pay

## 2020-02-14 ENCOUNTER — Encounter (HOSPITAL_BASED_OUTPATIENT_CLINIC_OR_DEPARTMENT_OTHER): Payer: Self-pay | Admitting: *Deleted

## 2020-02-14 ENCOUNTER — Emergency Department (HOSPITAL_BASED_OUTPATIENT_CLINIC_OR_DEPARTMENT_OTHER)
Admission: EM | Admit: 2020-02-14 | Discharge: 2020-02-14 | Disposition: A | Discharge status: Home / Self Care | Payer: Commercial Managed Care - PPO | Attending: Emergency Medicine | Admitting: Emergency Medicine

## 2020-02-14 DIAGNOSIS — Z7982 Long term (current) use of aspirin: Secondary | ICD-10-CM | POA: Diagnosis not present

## 2020-02-14 DIAGNOSIS — Z79899 Other long term (current) drug therapy: Secondary | ICD-10-CM | POA: Diagnosis not present

## 2020-02-14 DIAGNOSIS — I10 Essential (primary) hypertension: Secondary | ICD-10-CM | POA: Insufficient documentation

## 2020-02-14 DIAGNOSIS — Z794 Long term (current) use of insulin: Secondary | ICD-10-CM | POA: Insufficient documentation

## 2020-02-14 DIAGNOSIS — E1165 Type 2 diabetes mellitus with hyperglycemia: Secondary | ICD-10-CM | POA: Insufficient documentation

## 2020-02-14 DIAGNOSIS — Z87891 Personal history of nicotine dependence: Secondary | ICD-10-CM | POA: Insufficient documentation

## 2020-02-14 DIAGNOSIS — E119 Type 2 diabetes mellitus without complications: Secondary | ICD-10-CM | POA: Insufficient documentation

## 2020-02-14 DIAGNOSIS — R739 Hyperglycemia, unspecified: Secondary | ICD-10-CM

## 2020-02-14 LAB — BASIC METABOLIC PANEL
Anion gap: 9 (ref 5–15)
BUN: 17 mg/dL (ref 6–20)
CO2: 25 mmol/L (ref 22–32)
Calcium: 9.1 mg/dL (ref 8.9–10.3)
Chloride: 102 mmol/L (ref 98–111)
Creatinine, Ser: 0.96 mg/dL (ref 0.44–1.00)
GFR calc Af Amer: >60 mL/min (ref >60)
GFR calc non Af Amer: >60 mL/min (ref >60)
Glucose, Bld: 315 mg/dL — ABNORMAL HIGH (ref 70–99)
Potassium: 3.9 mmol/L (ref 3.5–5.1)
Sodium: 136 mmol/L (ref 135–145)

## 2020-02-14 LAB — CBC
HCT: 38.4 % (ref 36.0–46.0)
Hemoglobin: 12.9 g/dL (ref 12.0–15.0)
MCH: 26.9 pg (ref 26.0–34.0)
MCHC: 33.6 g/dL (ref 30.0–36.0)
MCV: 80 fL (ref 80.0–100.0)
Platelets: 268 10*3/uL (ref 150–400)
RBC: 4.8 MIL/uL (ref 3.87–5.11)
RDW: 11.8 % (ref 11.5–15.5)
WBC: 10 10*3/uL (ref 4.0–10.5)
nRBC: 0 % (ref 0.0–0.2)

## 2020-02-14 LAB — CBG MONITORING, ED
Glucose-Capillary: 293 mg/dL — ABNORMAL HIGH (ref 70–99)
Glucose-Capillary: 227 mg/dL — ABNORMAL HIGH (ref 70–99)

## 2020-02-14 MED ORDER — INSULIN REGULAR HUMAN 100 UNIT/ML IJ SOLN
6.0000 [IU] | Freq: Once | INTRAMUSCULAR | Status: DC
  Filled 2020-02-14: qty 3

## 2020-02-14 MED ORDER — SODIUM CHLORIDE 0.9 % IV BOLUS
1000.0000 mL | Freq: Once | INTRAVENOUS | Status: AC
  Administered 2020-02-14: 1000 mL via INTRAVENOUS

## 2020-02-14 MED ORDER — INSULIN ASPART 100 UNIT/ML ~~LOC~~ SOLN
6.0000 [IU] | Freq: Once | SUBCUTANEOUS | Status: AC
  Administered 2020-02-14: 6 [IU] via INTRAVENOUS
  Filled 2020-02-14: qty 1

## 2020-02-14 NOTE — ED Triage Notes (Signed)
Patient's CBG this morning was 298 and rechecked it again  334 and took her Glimepiride PTA.

## 2020-02-14 NOTE — Discharge Instructions (Addendum)
Keep a close eye on your blood sugars several times daily.  Make a list of the results and take this with you to your next doctor's appointment.  Ideally this should be within the next week.

## 2020-02-14 NOTE — ED Provider Notes (Signed)
[facility] EMERGENCY DEPARTMENT Provider Note   CSN: [id number] Arrival date & time: 02/14/20  3016     History Chief Complaint  Patient presents with  . Hyperglycemia    [name] is a 54 y.o. female.  Patient is a 53 year old female with past medical history of hypertension, obesity, diabetes not on insulin.  She presents today for evaluation of elevated blood sugar.  Patient woke up this morning and sugar was in excess of 300.  She took her Amaryl, then came here to be evaluated.  Patient does report eating increased sweets over the past 2 days.  She denies any abdominal pain, diarrhea, fever, or other specific complaint.  The history is provided by the patient.  Hyperglycemia Blood sugar level PTA:  300 Severity:  Moderate Onset quality:  Sudden Timing:  Constant Progression:  Unchanged Chronicity:  New Diabetes status:  Controlled with oral medications      Past Medical History:  Diagnosis Date  . Diabetes mellitus without complication (HCC)   . Hypertension   . Hypokalemia   . Obesity     Patient Active Problem List   Diagnosis Date Noted  . [place] fracture 04/27/2011    Past Surgical History:  Procedure Laterality Date  . ENDOMETRIAL ABLATION    . TUBAL LIGATION       OB History    Gravida  2   Para  2   Term      Preterm      AB      Living        SAB      TAB      Ectopic      Multiple      Live Births              Family History  Problem Relation Age of Onset  . Hypertension Mother   . Hypertension Father   . Diabetes Father   . Hypertension Sister   . Heart attack Neg Hx   . Sudden death Neg Hx   . Hyperlipidemia Neg Hx     Social History   Tobacco Use  . Smoking status: Former Games developer  . Smokeless tobacco: Never Used  Substance Use Topics  . Alcohol use: No    Comment: occasionally  . Drug use: No    Home Medications Prior to Admission medications   Medication Sig Start Date End Date  Taking? Authorizing Provider  aspirin 81 MG chewable tablet Chew by mouth daily.    [provider]  glimepiride (AMARYL) 1 MG tablet Take 1 mg by mouth daily. 04/06/19   [provider]  loratadine (CLARITIN) 10 MG tablet Take 1 tablet (10 mg total) by mouth daily. 01/10/16   Palumbo, April, MD  losartan (COZAAR) 25 MG tablet Take 25 mg by mouth daily.    [provider]  meloxicam (MOBIC) 15 MG tablet Take 15 mg by mouth daily. 07/15/19   [provider]  metoprolol succinate (TOPROL-XL) 100 MG 24 hr tablet Take 100 mg by mouth daily. 06/26/19   [provider]  metoprolol-hydrochlorothiazide (LOPRESSOR HCT) 50-25 MG per tablet Take 1 tablet by mouth daily.      [provider]  ranitidine (ZANTAC) 150 MG capsule Take 150 mg by mouth every evening.    [provider]    Allergies    Patient has no known allergies.  Review of Systems   Review of Systems  All other systems reviewed and  are negative.   Physical Exam Updated Vital Signs Ht 5\' 5"  (1.651 m)   Wt 113.4 kg   BMI 41.60 kg/m   Physical Exam Vitals and nursing note reviewed.  Constitutional:      General: She is not in acute distress.    Appearance: She is well-developed. She is not diaphoretic.  HENT:     Head: Normocephalic and atraumatic.  Cardiovascular:     Rate and Rhythm: Normal rate and regular rhythm.     Heart sounds: No murmur. No friction rub. No gallop.   Pulmonary:     Effort: Pulmonary effort is normal. No respiratory distress.     Breath sounds: Normal breath sounds. No wheezing.  Abdominal:     General: Bowel sounds are normal. There is no distension.     Palpations: Abdomen is soft.     Tenderness: There is no abdominal tenderness.  Musculoskeletal:        General: Normal range of motion.     Cervical back: Normal range of motion and neck supple.  Skin:    General: Skin is warm and dry.  Neurological:     Mental Status: She is alert  and oriented to person, place, and time.     ED Results / Procedures / Treatments   Labs (all labs ordered are listed, but only abnormal results are displayed) Labs Reviewed  CBG MONITORING, ED - Abnormal; Notable for the following components:      Result Value   Glucose-Capillary 293 (*)    All other components within normal limits  CBG MONITORING, ED    EKG None  Radiology No results found.  Procedures Procedures (including critical care time)  Medications Ordered in ED Medications  sodium chloride 0.9 % bolus 1,000 mL (has no administration in time range)  insulin regular (NOVOLIN R) 100 units/mL injection 6 Units (has no administration in time range)    ED Course  I have reviewed the triage vital signs and the nursing notes.  Pertinent labs & imaging results that were available during my care of the patient were reviewed by me and considered in my medical decision making (see chart for details).    MDM Rules/Calculators/A&P  Patient presents here with complaints of elevated blood sugars.  She took her sugar this morning and it was over 300 and this scared her.  Patient does admit to eating more sweets recently.  She was given IV fluids and insulin and sugars are improving.  There is no evidence for DKA or other emergent process.  Abdomen is benign and she has no other complaints.  Patient to be discharged with close monitoring of her blood sugar and follow-up with her primary doctor.  Final Clinical Impression(s) / ED Diagnoses Final diagnoses:  None    Rx / DC Orders ED Discharge Orders    None       Veryl Speak, MD 02/14/20 1228

## 2020-02-14 NOTE — ED Notes (Signed)
ED Provider at bedside. 

## 2020-04-18 ENCOUNTER — Other Ambulatory Visit: Payer: Self-pay

## 2020-04-18 ENCOUNTER — Ambulatory Visit (HOSPITAL_BASED_OUTPATIENT_CLINIC_OR_DEPARTMENT_OTHER): Payer: 59

## 2020-04-18 ENCOUNTER — Emergency Department (HOSPITAL_BASED_OUTPATIENT_CLINIC_OR_DEPARTMENT_OTHER): Payer: Commercial Managed Care - PPO

## 2020-04-18 ENCOUNTER — Emergency Department (HOSPITAL_BASED_OUTPATIENT_CLINIC_OR_DEPARTMENT_OTHER)
Admission: EM | Admit: 2020-04-18 | Discharge: 2020-04-18 | Disposition: A | Discharge status: Home / Self Care | Payer: Commercial Managed Care - PPO | Attending: Emergency Medicine | Admitting: Emergency Medicine

## 2020-04-18 ENCOUNTER — Encounter (HOSPITAL_BASED_OUTPATIENT_CLINIC_OR_DEPARTMENT_OTHER): Payer: Self-pay | Admitting: Emergency Medicine

## 2020-04-18 DIAGNOSIS — Z5321 Procedure and treatment not carried out due to patient leaving prior to being seen by health care provider: Secondary | ICD-10-CM | POA: Insufficient documentation

## 2020-04-18 DIAGNOSIS — M25512 Pain in left shoulder: Secondary | ICD-10-CM | POA: Insufficient documentation

## 2020-04-18 HISTORY — DX: Unspecified rotator cuff tear or rupture of right shoulder, not specified as traumatic: M75.101

## 2020-04-18 NOTE — ED Triage Notes (Addendum)
Pt arrives with left arm/shoulder pain, worse while laying on it and with position changes. States "it's been some minutes now" states has been getting worse. Ongoing for months. States it feels the same as her right shoulder prior to surgery.

## 2020-05-21 ENCOUNTER — Other Ambulatory Visit: Payer: Self-pay

## 2020-05-21 ENCOUNTER — Encounter (HOSPITAL_BASED_OUTPATIENT_CLINIC_OR_DEPARTMENT_OTHER): Payer: Self-pay

## 2020-05-21 ENCOUNTER — Emergency Department (HOSPITAL_BASED_OUTPATIENT_CLINIC_OR_DEPARTMENT_OTHER)
Admission: EM | Admit: 2020-05-21 | Discharge: 2020-05-21 | Disposition: A | Discharge status: Home / Self Care | Payer: Commercial Managed Care - PPO | Attending: Emergency Medicine | Admitting: Emergency Medicine

## 2020-05-21 DIAGNOSIS — Z7982 Long term (current) use of aspirin: Secondary | ICD-10-CM | POA: Insufficient documentation

## 2020-05-21 DIAGNOSIS — E119 Type 2 diabetes mellitus without complications: Secondary | ICD-10-CM | POA: Insufficient documentation

## 2020-05-21 DIAGNOSIS — I10 Essential (primary) hypertension: Secondary | ICD-10-CM | POA: Diagnosis not present

## 2020-05-21 DIAGNOSIS — Z7984 Long term (current) use of oral hypoglycemic drugs: Secondary | ICD-10-CM | POA: Insufficient documentation

## 2020-05-21 DIAGNOSIS — Z87891 Personal history of nicotine dependence: Secondary | ICD-10-CM | POA: Diagnosis not present

## 2020-05-21 DIAGNOSIS — Z79899 Other long term (current) drug therapy: Secondary | ICD-10-CM | POA: Diagnosis not present

## 2020-05-21 DIAGNOSIS — K219 Gastro-esophageal reflux disease without esophagitis: Secondary | ICD-10-CM | POA: Diagnosis not present

## 2020-05-21 MED ORDER — ALUM & MAG HYDROXIDE-SIMETH 200-200-20 MG/5ML PO SUSP
30.0000 mL | Freq: Once | ORAL | Status: AC
  Administered 2020-05-21: 12:00:00 30 mL via ORAL
  Filled 2020-05-21: qty 30

## 2020-05-21 MED ORDER — PANTOPRAZOLE SODIUM 20 MG PO TBEC
20.0000 mg | DELAYED_RELEASE_TABLET | Freq: Every day | ORAL | 0 refills | Status: AC
Start: 2020-05-21 — End: ?

## 2020-05-21 MED FILL — PANTOPRAZOLE SOD DR 20 MG T: 20 | 30 days supply | Qty: 30 | Fill #0

## 2020-05-21 NOTE — ED Triage Notes (Signed)
Pt c/o increase in belching and flatulence, GERD "where I taste my food" x 2-3 months-NAD-steady gait

## 2020-05-21 NOTE — ED Notes (Signed)
ED Provider at bedside. 

## 2020-05-21 NOTE — ED Provider Notes (Signed)
[facility] EMERGENCY DEPARTMENT Provider Note   CSN: [id number] Arrival date & time: 05/21/20  1134     History Chief Complaint  Patient presents with  . Gas    [name] is a 54 y.o. female.  HPI    Pt is a 54 y/o female with a h/o HTN, DM, hypokalemia, obesity, who presents to the ED today for evaluation of acid reflux. States she has had intermittent abd bloating, burning sensation to abdomen increased burping, regurgitation of acid, increased flatulence, bad taste/smell in her mouth when she burps.   She has tried maalox, OTC acid reflux medications.  States she has seen a GI doctor in the past and has required esophageal dilation.  She does not remember who she followed up with and I cannot find this information in her chart.  Past Medical History:  Diagnosis Date  . Diabetes mellitus without complication (HCC)   . Hypertension   . Hypokalemia   . Obesity   . Rotator cuff syndrome of right shoulder     Patient Active Problem List   Diagnosis Date Noted  . [place] fracture 04/27/2011    Past Surgical History:  Procedure Laterality Date  . ENDOMETRIAL ABLATION    . ROTATOR CUFF REPAIR Right   . TUBAL LIGATION       OB History    Gravida  2   Para  2   Term      Preterm      AB      Living        SAB      TAB      Ectopic      Multiple      Live Births              Family History  Problem Relation Age of Onset  . Hypertension Mother   . Hypertension Father   . Diabetes Father   . Hypertension Sister   . Heart attack Neg Hx   . Sudden death Neg Hx   . Hyperlipidemia Neg Hx     Social History   Tobacco Use  . Smoking status: Former Games developer  . Smokeless tobacco: Never Used  Vaping Use  . Vaping Use: Never used  Substance Use Topics  . Alcohol use: No  . Drug use: No    Home Medications Prior to Admission medications   Medication Sig Start Date End Date Taking? Authorizing Provider  amLODipine (NORVASC) 5 MG  tablet Take 5 mg by mouth daily. 04/08/20  Yes [provider]  aspirin 81 MG chewable tablet Chew by mouth daily.   Yes [provider]  cetirizine (ZYRTEC) 10 MG tablet Take 10 mg by mouth daily as needed. 04/16/20  Yes [provider]  glimepiride (AMARYL) 1 MG tablet Take 1 mg by mouth daily. 04/06/19  Yes [provider]  loratadine (CLARITIN) 10 MG tablet Take 1 tablet (10 mg total) by mouth daily. 01/10/16  Yes Palumbo, April, MD  losartan (COZAAR) 100 MG tablet Take 100 mg by mouth daily. 03/27/20  Yes [provider]  losartan (COZAAR) 25 MG tablet Take 25 mg by mouth daily.   Yes [provider]  metoprolol succinate (TOPROL-XL) 100 MG 24 hr tablet Take 100 mg by mouth daily. 06/26/19  Yes [provider]  ranitidine (ZANTAC) 150 MG capsule Take 150 mg by mouth every evening.   Yes [provider]  meloxicam (MOBIC) 15 MG tablet Take 15 mg  by mouth daily. 07/15/19   [provider]  OZEMPIC, 0.25 OR 0.5 MG/DOSE, 2 MG/1.5ML SOPN Inject into the skin. 02/23/20   [provider]  pantoprazole (PROTONIX) 20 MG tablet Take 1 tablet (20 mg total) by mouth daily. 05/21/20   Maurya Nethery S, PA-C    Allergies    Patient has no known allergies.  Review of Systems   Review of Systems  Constitutional: Negative for fever.  HENT: Negative for ear pain and sore throat.   Eyes: Negative for visual disturbance.  Respiratory: Negative for cough and shortness of breath.   Cardiovascular: Negative for chest pain.  Gastrointestinal: Negative for abdominal pain, constipation, diarrhea, nausea and vomiting.       Abd bloating, burning abd discomfort  Genitourinary: Negative for dysuria and hematuria.  Musculoskeletal: Negative for back pain.  Skin: Negative for rash.  Neurological: Negative for headaches.  All other systems reviewed and are negative.   Physical Exam Updated Vital Signs BP (!) 161/90 (BP Location:  Left Arm)   Pulse 90   Temp 98.7 F (37.1 C) (Oral)   Resp 20   Ht 5\' 4"  (1.626 m)   Wt (!) 112 kg   SpO2 100%   BMI 42.40 kg/m   Physical Exam Vitals and nursing note reviewed.  Constitutional:      General: She is not in acute distress.    Appearance: She is well-developed.  HENT:     Head: Normocephalic and atraumatic.  Eyes:     Conjunctiva/sclera: Conjunctivae normal.  Cardiovascular:     Rate and Rhythm: Normal rate and regular rhythm.     Heart sounds: Normal heart sounds. No murmur heard.   Pulmonary:     Effort: Pulmonary effort is normal. No respiratory distress.     Breath sounds: Normal breath sounds. No wheezing, rhonchi or rales.  Abdominal:     General: Bowel sounds are normal.     Palpations: Abdomen is soft.     Tenderness: There is no abdominal tenderness. There is no guarding or rebound.  Musculoskeletal:     Cervical back: Neck supple.  Skin:    General: Skin is warm and dry.  Neurological:     Mental Status: She is alert.     ED Results / Procedures / Treatments   Labs (all labs ordered are listed, but only abnormal results are displayed) Labs Reviewed - No data to display  EKG None  Radiology No results found.  Procedures Procedures (including critical care time)  Medications Ordered in ED Medications  alum & mag hydroxide-simeth (MAALOX/MYLANTA) 200-200-20 MG/5ML suspension 30 mL (30 mLs Oral Given 05/21/20 1204)    ED Course  I have reviewed the triage vital signs and the nursing notes.  Pertinent labs & imaging results that were available during my care of the patient were reviewed by me and considered in my medical decision making (see chart for details).    MDM Rules/Calculators/A&P                          54 year old female presenting for evaluation of acid reflux symptoms with increased belching/burping, burning abdominal pain and intermittent regurgitation for several months.  No chest pain or shortness of breath to  suggest symptoms are related to ACS.  She is been taking over-the-counter medications without relief.  On exam she is in no acute distress.  Her abdomen is soft and nontender.  Heart and lung exam are within  normal limits.  She was given a GI cocktail and I will start her on Protonix.  She was given a referral to gastroenterology.  Advised on plan for follow-up and strict return precautions.  She voices understanding of the plan and reasons to return.  All questions answered.  Patient stable for discharge.   Final Clinical Impression(s) / ED Diagnoses Final diagnoses:  Gastroesophageal reflux disease, unspecified whether esophagitis present    Rx / DC Orders ED Discharge Orders         Ordered    pantoprazole (PROTONIX) 20 MG tablet  Daily     Discontinue  Reprint     05/21/20 1207           Azarya Oconnell S, PA-C 05/21/20 1209    Virgina Norfolk, DO 05/21/20 1224

## 2020-05-21 NOTE — Discharge Instructions (Signed)
Take protonix as directed.   You were given a referral to gastroenterology. Please call the office to schedule an appointment for follow up.  Please return to the emergency department for any new or worsening symptoms.

## 2020-10-12 ENCOUNTER — Emergency Department (HOSPITAL_BASED_OUTPATIENT_CLINIC_OR_DEPARTMENT_OTHER)
Admission: EM | Admit: 2020-10-12 | Discharge: 2020-10-12 | Disposition: A | Discharge status: Home / Self Care | Payer: Commercial Managed Care - PPO | Attending: Emergency Medicine | Admitting: Emergency Medicine

## 2020-10-12 ENCOUNTER — Other Ambulatory Visit: Payer: Self-pay

## 2020-10-12 ENCOUNTER — Encounter (HOSPITAL_BASED_OUTPATIENT_CLINIC_OR_DEPARTMENT_OTHER): Payer: Self-pay | Admitting: Emergency Medicine

## 2020-10-12 ENCOUNTER — Emergency Department (HOSPITAL_BASED_OUTPATIENT_CLINIC_OR_DEPARTMENT_OTHER): Payer: Commercial Managed Care - PPO

## 2020-10-12 DIAGNOSIS — R0981 Nasal congestion: Secondary | ICD-10-CM | POA: Diagnosis not present

## 2020-10-12 DIAGNOSIS — R1012 Left upper quadrant pain: Secondary | ICD-10-CM | POA: Diagnosis not present

## 2020-10-12 DIAGNOSIS — Z87891 Personal history of nicotine dependence: Secondary | ICD-10-CM | POA: Diagnosis not present

## 2020-10-12 DIAGNOSIS — Z7984 Long term (current) use of oral hypoglycemic drugs: Secondary | ICD-10-CM | POA: Diagnosis not present

## 2020-10-12 DIAGNOSIS — Z79899 Other long term (current) drug therapy: Secondary | ICD-10-CM | POA: Diagnosis not present

## 2020-10-12 DIAGNOSIS — K219 Gastro-esophageal reflux disease without esophagitis: Secondary | ICD-10-CM | POA: Diagnosis not present

## 2020-10-12 DIAGNOSIS — E119 Type 2 diabetes mellitus without complications: Secondary | ICD-10-CM | POA: Insufficient documentation

## 2020-10-12 DIAGNOSIS — I10 Essential (primary) hypertension: Secondary | ICD-10-CM | POA: Insufficient documentation

## 2020-10-12 DIAGNOSIS — Z7982 Long term (current) use of aspirin: Secondary | ICD-10-CM | POA: Diagnosis not present

## 2020-10-12 DIAGNOSIS — R079 Chest pain, unspecified: Secondary | ICD-10-CM | POA: Diagnosis present

## 2020-10-12 DIAGNOSIS — R0789 Other chest pain: Secondary | ICD-10-CM

## 2020-10-12 LAB — CBC
HCT: 36.7 % (ref 36.0–46.0)
Hemoglobin: 12.1 g/dL (ref 12.0–15.0)
MCH: 26.2 pg (ref 26.0–34.0)
MCHC: 33 g/dL (ref 30.0–36.0)
MCV: 79.4 fL — ABNORMAL LOW (ref 80.0–100.0)
Platelets: 263 10*3/uL (ref 150–400)
RBC: 4.62 MIL/uL (ref 3.87–5.11)
RDW: 12.6 % (ref 11.5–15.5)
WBC: 8.3 10*3/uL (ref 4.0–10.5)
nRBC: 0 % (ref 0.0–0.2)

## 2020-10-12 LAB — COMPREHENSIVE METABOLIC PANEL
ALT: 21 U/L (ref 0–44)
AST: 21 U/L (ref 15–41)
Albumin: 3.7 g/dL (ref 3.5–5.0)
Alkaline Phosphatase: 45 U/L (ref 38–126)
Anion gap: 7 (ref 5–15)
BUN: 14 mg/dL (ref 6–20)
CO2: 25 mmol/L (ref 22–32)
Calcium: 9.1 mg/dL (ref 8.9–10.3)
Chloride: 105 mmol/L (ref 98–111)
Creatinine, Ser: 0.97 mg/dL (ref 0.44–1.00)
GFR, Estimated: >60 mL/min (ref >60)
Glucose, Bld: 137 mg/dL — ABNORMAL HIGH (ref 70–99)
Potassium: 4.3 mmol/L (ref 3.5–5.1)
Sodium: 137 mmol/L (ref 135–145)
Total Bilirubin: 0.4 mg/dL (ref 0.3–1.2)
Total Protein: 7.3 g/dL (ref 6.5–8.1)

## 2020-10-12 LAB — TROPONIN I (HIGH SENSITIVITY): Troponin I (High Sensitivity): <2 ng/L (ref <18)

## 2020-10-12 MED ORDER — LIDOCAINE VISCOUS HCL 2 % MT SOLN: 15.0000 mL | Freq: Four times a day (QID) | OROMUCOSAL | 0 refills | Status: AC | PRN

## 2020-10-12 MED ORDER — ALUM & MAG HYDROXIDE-SIMETH 200-200-20 MG/5ML PO SUSP
30.0000 mL | Freq: Once | ORAL | Status: AC
  Administered 2020-10-12: 07:00:00 30 mL via ORAL
  Filled 2020-10-12: qty 30

## 2020-10-12 MED ORDER — LIDOCAINE VISCOUS HCL 2 % MT SOLN: 15.0000 mL | Freq: Four times a day (QID) | OROMUCOSAL | 0 refills | Status: DC | PRN

## 2020-10-12 MED ORDER — ALUM & MAG HYDROXIDE-SIMETH 400-400-40 MG/5ML PO SUSP
10.0000 mL | Freq: Four times a day (QID) | ORAL | 0 refills | Status: AC | PRN
Start: 2020-10-12 — End: ?

## 2020-10-12 MED ORDER — HYOSCYAMINE SULFATE 0.125 MG SL SUBL
0.2500 mg | SUBLINGUAL_TABLET | Freq: Once | SUBLINGUAL | Status: AC
  Administered 2020-10-12: 07:00:00 0.25 mg via SUBLINGUAL
  Filled 2020-10-12: qty 2

## 2020-10-12 MED ORDER — LIDOCAINE VISCOUS HCL 2 % MT SOLN
15.0000 mL | Freq: Once | OROMUCOSAL | Status: AC
  Administered 2020-10-12: 07:00:00 15 mL via ORAL
  Filled 2020-10-12: qty 15

## 2020-10-12 NOTE — ED Triage Notes (Signed)
Reports chest tightness and nasal congestion, concerned for acid reflux, hx of same.

## 2020-10-12 NOTE — ED Provider Notes (Signed)
[facility] EMERGENCY DEPARTMENT Provider Note  CSN: [id number] Arrival date & time: 10/12/20 7673  Chief Complaint(s) Chest Pain (Hx gerd) and Nasal Congestion  HPI Julie [name] is a 54 y.o. female with a past medical history listed below including hypertension, diabetes, and GERD here for 1 week of chest discomfort described as needing to burp. Constant and fluctuating since onset. Worse tonight after eating. Nonradiating and nonexertional. No associated shortness of breath. No nausea vomiting.. Mild left upper quadrant discomfort. No diarrhea. No other physical complaints.  HPI  Past Medical History Past Medical History:  Diagnosis Date  . Diabetes mellitus without complication (HCC)   . Hypertension   . Hypokalemia   . Obesity   . Rotator cuff syndrome of right shoulder    Patient Active Problem List   Diagnosis Date Noted  . [place] fracture 04/27/2011   Home Medication(s) Prior to Admission medications   Medication Sig Start Date End Date Taking? Authorizing Provider  alum & mag hydroxide-simeth (MAALOX ADVANCED MAX ST) 400-400-40 MG/5ML suspension Take 10 mLs by mouth every 6 (six) hours as needed for indigestion. 10/12/20   Nira Conn, MD  amLODipine (NORVASC) 5 MG tablet Take 5 mg by mouth daily. 04/08/20   [provider]  aspirin 81 MG chewable tablet Chew by mouth daily.    [provider]  cetirizine (ZYRTEC) 10 MG tablet Take 10 mg by mouth daily as needed. 04/16/20   [provider]  glimepiride (AMARYL) 1 MG tablet Take 1 mg by mouth daily. 04/06/19   [provider]  lidocaine (XYLOCAINE) 2 % solution Use as directed 15 mLs in the mouth or throat every 6 (six) hours as needed for mouth pain. 10/12/20   Nira Conn, MD  loratadine (CLARITIN) 10 MG tablet Take 1 tablet (10 mg total) by mouth daily. 01/10/16   Palumbo, April, MD  losartan (COZAAR) 100 MG tablet Take 100 mg by mouth daily.  03/27/20   [provider]  losartan (COZAAR) 25 MG tablet Take 25 mg by mouth daily.    [provider]  meloxicam (MOBIC) 15 MG tablet Take 15 mg by mouth daily. 07/15/19   [provider]  metoprolol succinate (TOPROL-XL) 100 MG 24 hr tablet Take 100 mg by mouth daily. 06/26/19   [provider]  OZEMPIC, 0.25 OR 0.5 MG/DOSE, 2 MG/1.5ML SOPN Inject into the skin. 02/23/20   [provider]  pantoprazole (PROTONIX) 20 MG tablet Take 1 tablet (20 mg total) by mouth daily. 05/21/20   Couture, Cortni S, PA-C  ranitidine (ZANTAC) 150 MG capsule Take 150 mg by mouth every evening.    [provider]                                                                                                                                    Past Surgical History Past Surgical History:  Procedure Laterality  Date  . ENDOMETRIAL ABLATION    . ROTATOR CUFF REPAIR Right   . TUBAL LIGATION     Family History Family History  Problem Relation Age of Onset  . Hypertension Mother   . Hypertension Father   . Diabetes Father   . Hypertension Sister   . Heart attack Neg Hx   . Sudden death Neg Hx   . Hyperlipidemia Neg Hx     Social History Social History   Tobacco Use  . Smoking status: Former Games developer  . Smokeless tobacco: Never Used  Vaping Use  . Vaping Use: Never used  Substance Use Topics  . Alcohol use: No  . Drug use: No   Allergies Patient has no known allergies.  Review of Systems Review of Systems All other systems are reviewed and are negative for acute change except as noted in the HPI  Physical Exam Vital Signs  I have reviewed the triage vital signs BP 132/87 (BP Location: Right Arm)   Pulse 60   Temp 98.1 F (36.7 C) (Oral)   Resp 16   Wt 114.2 kg   SpO2 97%   BMI 43.22 kg/m   Physical Exam Vitals reviewed.  Constitutional:      General: She is not in acute distress.    Appearance: She is well-developed and well-nourished.  She is not diaphoretic.  HENT:     Head: Normocephalic and atraumatic.     Nose: Nose normal.  Eyes:     General: No scleral icterus.       Right eye: No discharge.        Left eye: No discharge.     Extraocular Movements: EOM normal.     Conjunctiva/sclera: Conjunctivae normal.     Pupils: Pupils are equal, round, and reactive to light.  Cardiovascular:     Rate and Rhythm: Normal rate and regular rhythm.     Heart sounds: No murmur heard. No friction rub. No gallop.   Pulmonary:     Effort: Pulmonary effort is normal. No respiratory distress.     Breath sounds: Normal breath sounds. No stridor. No rales.  Abdominal:     General: There is no distension.     Palpations: Abdomen is soft.     Tenderness: There is abdominal tenderness (mild discomfort) in the left upper quadrant. There is no guarding or rebound.  Musculoskeletal:        General: No tenderness or edema.     Cervical back: Normal range of motion and neck supple.  Skin:    General: Skin is warm and dry.     Findings: No erythema or rash.  Neurological:     Mental Status: She is alert and oriented to person, place, and time.  Psychiatric:        Mood and Affect: Mood and affect normal.     ED Results and Treatments Labs (all labs ordered are listed, but only abnormal results are displayed) Labs Reviewed  CBC - Abnormal; Notable for the following components:      Result Value   MCV 79.4 (*)    All other components within normal limits  COMPREHENSIVE METABOLIC PANEL - Abnormal; Notable for the following components:   Glucose, Bld 137 (*)    All other components within normal limits  TROPONIN I (HIGH SENSITIVITY)  EKG  EKG Interpretation  Date/Time:  Sunday October 12 2020 05:36:33 EST Ventricular Rate:  67 PR Interval:    QRS Duration: 85 QT Interval:  409 QTC Calculation: 432 R  Axis:   22 Text Interpretation: Sinus rhythm No acute changes Confirmed by Drema Pryardama, Lai Hendriks [phone]) on 10/12/2020 5:40:11 AM      Radiology No results found.  Pertinent labs & imaging results that were available during my care of the patient were reviewed by me and considered in my medical decision making (see chart for details).  Medications Ordered in ED Medications  alum & mag hydroxide-simeth (MAALOX/MYLANTA) 200-200-20 MG/5ML suspension 30 mL (30 mLs Oral Given 10/12/20 [id number])    And  lidocaine (XYLOCAINE) 2 % viscous mouth solution 15 mL (15 mLs Oral Given 10/12/20 0635)  hyoscyamine (LEVSIN SL) SL tablet 0.25 mg (0.25 mg Sublingual Given 10/12/20 [id number])                                                                                                                                    Procedures Procedures  (including critical care time)  Medical Decision Making / ED Course I have reviewed the nursing notes for this encounter and the patient's prior records (if available in EHR or on provided paperwork).   Hurshel KeysDavina A [name] was evaluated in Emergency Department on 10/13/2020 for the symptoms described in the history of present illness. She was evaluated in the context of the global COVID-19 pandemic, which necessitated consideration that the patient might be at risk for infection with the SARS-CoV-2 virus that causes COVID-19. Institutional protocols and algorithms that pertain to the evaluation of patients at risk for COVID-19 are in a state of rapid change based on information released by regulatory bodies including the CDC and federal and state organizations. These policies and algorithms were followed during the patient's care in the ED.  1 week of chest discomfort described as "need to burp." Highly inconsistent with ACS. EKG without acute ischemic changes or evidence of pericarditis. Given patient's past medical history will obtain a single troponin which I believe should be  sufficient to rule out ACS in this atypical picture with 1 week of persistent symptoms. Trope negative.  Low suspicion for pulmonary embolism.  Presentation not classic for aortic dissection or esophageal perforation. Chest x-ray without evidence suggestive of pneumonia, pneumothorax, pneumomediastinum.  No abnormal contour of the mediastinum to suggest dissection. No evidence of acute injuries.  Most consistent with GI etiology.  She was provided with GI cocktail which provided significant relief      Final Clinical Impression(s) / ED Diagnoses Final diagnoses:  Feeling of chest tightness  Chest pain due to GERD   The patient appears reasonably screened and/or stabilized for discharge and I doubt any other medical condition or other [facility] requiring further screening, evaluation, or treatment in the ED at this time prior to discharge. Safe for discharge with strict return precautions.  Disposition: Discharge  Condition: Good  I have discussed the results, Dx and Tx plan with the patient/family who expressed understanding and agree(s) with the plan. Discharge instructions discussed at length. The patient/family was given strict return precautions who verbalized understanding of the instructions. No further questions at time of discharge.    ED Discharge Orders         Ordered    alum & mag hydroxide-simeth (MAALOX ADVANCED MAX ST) 400-400-40 MG/5ML suspension  Every 6 hours PRN        10/12/20 0727    lidocaine (XYLOCAINE) 2 % solution  Every 6 hours PRN,   Status:  Discontinued        10/12/20 0727    lidocaine (XYLOCAINE) 2 % solution  Every 6 hours PRN        10/12/20 0738           Follow Up: Vista Deck, NP [phone] [place] [address] [place] [address] [phone]  Call  To schedule an appointment for close follow up      This chart was dictated using voice recognition software.  Despite best efforts to proofread,  errors can occur which can  change the documentation meaning.   Nira Conn, MD 10/13/20 1002

## 2020-10-12 NOTE — ED Notes (Signed)
Pt discharged to home. Discharge instructions have been discussed with patient and/or family members. Pt verbally acknowledges understanding d/c instructions, and endorses comprehension to checkout at registration before leaving.  °

## 2023-04-06 ENCOUNTER — Emergency Department (HOSPITAL_BASED_OUTPATIENT_CLINIC_OR_DEPARTMENT_OTHER)
Admission: EM | Admit: 2023-04-06 | Discharge: 2023-04-06 | Disposition: A | Discharge status: Home / Self Care | Payer: Commercial Managed Care - PPO | Attending: Emergency Medicine | Admitting: Emergency Medicine

## 2023-04-06 ENCOUNTER — Other Ambulatory Visit: Payer: Self-pay

## 2023-04-06 ENCOUNTER — Encounter (HOSPITAL_BASED_OUTPATIENT_CLINIC_OR_DEPARTMENT_OTHER): Payer: Self-pay

## 2023-04-06 ENCOUNTER — Other Ambulatory Visit (HOSPITAL_BASED_OUTPATIENT_CLINIC_OR_DEPARTMENT_OTHER): Payer: Self-pay

## 2023-04-06 DIAGNOSIS — Z794 Long term (current) use of insulin: Secondary | ICD-10-CM | POA: Insufficient documentation

## 2023-04-06 DIAGNOSIS — I10 Essential (primary) hypertension: Secondary | ICD-10-CM | POA: Diagnosis not present

## 2023-04-06 DIAGNOSIS — Z79899 Other long term (current) drug therapy: Secondary | ICD-10-CM | POA: Insufficient documentation

## 2023-04-06 DIAGNOSIS — G8929 Other chronic pain: Secondary | ICD-10-CM | POA: Insufficient documentation

## 2023-04-06 DIAGNOSIS — M25511 Pain in right shoulder: Secondary | ICD-10-CM | POA: Diagnosis present

## 2023-04-06 DIAGNOSIS — M25512 Pain in left shoulder: Secondary | ICD-10-CM | POA: Diagnosis not present

## 2023-04-06 DIAGNOSIS — Z7984 Long term (current) use of oral hypoglycemic drugs: Secondary | ICD-10-CM | POA: Diagnosis not present

## 2023-04-06 DIAGNOSIS — Z96641 Presence of right artificial hip joint: Secondary | ICD-10-CM | POA: Diagnosis not present

## 2023-04-06 DIAGNOSIS — Z7982 Long term (current) use of aspirin: Secondary | ICD-10-CM | POA: Diagnosis not present

## 2023-04-06 DIAGNOSIS — E119 Type 2 diabetes mellitus without complications: Secondary | ICD-10-CM | POA: Insufficient documentation

## 2023-04-06 MED ORDER — MELOXICAM 15 MG PO TABS
15.0000 mg | ORAL_TABLET | Freq: Every day | ORAL | 0 refills | Status: AC
  Filled 2023-04-06: qty 30, 30d supply, fill #0

## 2023-04-06 NOTE — ED Triage Notes (Signed)
C/o bilateral shoulder pain and body soreness x 2 weeks. Unsure of cause. States pain worse with movement.

## 2023-04-06 NOTE — ED Provider Notes (Signed)
[place] EMERGENCY DEPARTMENT AT [facility] Provider Note   CSN: [id number] Arrival date & time: 04/06/23  4098     History  Chief Complaint  Patient presents with   Shoulder Pain    AHLEAH [name] is a 57 y.o. female with history of HTN, diabetes, polyarthralgias who presents to the ER complaining of bilateral shoulder pain and overall body soreness x 2 weeks. Reports pain is worse with movement. No known injury. Last saw her orthopedist in April and got a steroid injection in her right shoulder. Hx of R hip replacement. No numbness, weakness, bowel or bladder incontinence. No fevers or recent illness.    Shoulder Pain      Home Medications Prior to Admission medications   Medication Sig Start Date End Date Taking? Authorizing Provider  meloxicam (MOBIC) 15 MG tablet Take 1 tablet (15 mg total) by mouth daily. 04/06/23  Yes Brantlee Penn T, PA-C  alum & mag hydroxide-simeth (MAALOX ADVANCED MAX ST) 400-400-40 MG/5ML suspension Take 10 mLs by mouth every 6 (six) hours as needed for indigestion. 10/12/20   Nira Conn, MD  amLODipine (NORVASC) 5 MG tablet Take 5 mg by mouth daily. 04/08/20   [provider]  aspirin 81 MG chewable tablet Chew by mouth daily.    [provider]  cetirizine (ZYRTEC) 10 MG tablet Take 10 mg by mouth daily as needed. 04/16/20   [provider]  glimepiride (AMARYL) 1 MG tablet Take 1 mg by mouth daily. 04/06/19   [provider]  lidocaine (XYLOCAINE) 2 % solution Use as directed 15 mLs in the mouth or throat every 6 (six) hours as needed for mouth pain. 10/12/20   Nira Conn, MD  loratadine (CLARITIN) 10 MG tablet Take 1 tablet (10 mg total) by mouth daily. 01/10/16   Palumbo, April, MD  losartan (COZAAR) 100 MG tablet Take 100 mg by mouth daily. 03/27/20   [provider]  losartan (COZAAR) 25 MG tablet Take 25 mg by mouth daily.    [provider]  metoprolol  succinate (TOPROL-XL) 100 MG 24 hr tablet Take 100 mg by mouth daily. 06/26/19   [provider]  OZEMPIC, 0.25 OR 0.5 MG/DOSE, 2 MG/1.5ML SOPN Inject into the skin. 02/23/20   [provider]  pantoprazole (PROTONIX) 20 MG tablet Take 1 tablet (20 mg total) by mouth daily. 05/21/20   Couture, Cortni S, PA-C  ranitidine (ZANTAC) 150 MG capsule Take 150 mg by mouth every evening.    [provider]      Allergies    Metformin    Review of Systems   Review of Systems  Respiratory:  Negative for shortness of breath.   Cardiovascular:  Negative for chest pain.  Musculoskeletal:  Positive for arthralgias.  Neurological:  Negative for weakness and numbness.  All other systems reviewed and are negative.   Physical Exam Updated Vital Signs BP 131/78 (BP Location: Left Arm)   Pulse 72   Temp 98.4 F (36.9 C) (Oral)   Resp 16   Ht 5\' 4"  (1.626 m)   Wt 103 kg   SpO2 100%   BMI 38.96 kg/m  Physical Exam Vitals and nursing note reviewed.  Constitutional:      Appearance: Normal appearance.  HENT:     Head: Normocephalic and atraumatic.  Eyes:     Conjunctiva/sclera: Conjunctivae normal.  Pulmonary:     Effort: Pulmonary effort is normal. No respiratory distress.  Musculoskeletal:  Cervical back: Full passive range of motion without pain.     Comments: No bony deformities or tenderness palpated to the bilateral shoulders. Reproducible pain at the extremes of shoulder flexion and abduction. Normal grip strength. Normal sensation in all extremities.   Skin:    General: Skin is warm and dry.  Neurological:     Mental Status: She is alert.  Psychiatric:        Mood and Affect: Mood normal.        Behavior: Behavior normal.     ED Results / Procedures / Treatments   Labs (all labs ordered are listed, but only abnormal results are displayed) Labs Reviewed - No data to display  EKG None  Radiology No results found.  Procedures Procedures     Medications Ordered in ED Medications - No data to display  ED Course/ Medical Decision Making/ A&P                             Medical Decision Making  This patient is a 57 y.o. female  who presents to the ED for concern of bilateral shoulder pain for several weeks.   Differential diagnoses prior to evaluation: The emergent differential diagnosis includes, but is not limited to,  septic arthritis, radiculopathy, myelopathy, meningitis. This is not an exhaustive differential.   Past Medical History / Co-morbidities / Social History: HTN, diabetes, polyarthralgias  Additional history: Chart reviewed. Pertinent results include: numerous visits in chart with PCP and orthopedics for arthritis, including previous R shoulder steroid injection, most recently on 4/15. 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   Physical Exam: Physical exam performed. The pertinent findings include: Reproducible pain at the extremes of movement of bilateral shoulders, with normal grip strength and sensation. No bony tenderness or deformities. No red flag symptoms.   Disposition: After consideration of the diagnostic results and the patients response to treatment, I feel that emergency department workup does not suggest an emergent condition requiring admission or immediate intervention beyond what has been performed at this time. The plan is: discharge to home with anti-inflammatories for bilateral shoulder pain, likely in the setting of known osteoarthritis. No findings concerning for radiculopathy/myelopathy, suspect peripheral musculoskeletal etiology of symptoms. Will encourage follow up with orthopedics.  The patient is safe for discharge and has been instructed to return immediately for worsening symptoms, change in symptoms or any other concerns.  Final Clinical Impression(s) / ED Diagnoses Final diagnoses:  Chronic pain of both shoulders    Rx / DC Orders ED Discharge Orders          Ordered     meloxicam (MOBIC) 15 MG tablet  Daily        04/06/23 1046           Portions of this report may have been transcribed using voice recognition software. Every effort was made to ensure accuracy; however, inadvertent computerized transcription errors may be present.    [name] 04/06/23 1051    Linwood Dibbles, MD 04/07/23 [phone]

## 2023-04-06 NOTE — Discharge Instructions (Addendum)
You were seen in the ER for joint pain.  As we discussed, I think this is likely a flare of your ongoing arthritis. We decided against x-rays today because I do not have concerns you broke or dislocated any bones.  I'm prescribing you a different anti-inflammatory you can use to help. I also recommend using a heating pad over your shoulders, and doing gentle exercise.  Please follow up with your orthopedist.

## 2023-05-25 ENCOUNTER — Other Ambulatory Visit: Payer: Self-pay

## 2023-05-25 ENCOUNTER — Encounter (HOSPITAL_BASED_OUTPATIENT_CLINIC_OR_DEPARTMENT_OTHER): Payer: Self-pay

## 2023-05-25 ENCOUNTER — Emergency Department (HOSPITAL_BASED_OUTPATIENT_CLINIC_OR_DEPARTMENT_OTHER)
Admission: EM | Admit: 2023-05-25 | Discharge: 2023-05-25 | Disposition: A | Discharge status: Home / Self Care | Payer: Commercial Managed Care - PPO | Attending: Emergency Medicine | Admitting: Emergency Medicine

## 2023-05-25 DIAGNOSIS — I10 Essential (primary) hypertension: Secondary | ICD-10-CM | POA: Insufficient documentation

## 2023-05-25 DIAGNOSIS — Z79899 Other long term (current) drug therapy: Secondary | ICD-10-CM | POA: Diagnosis not present

## 2023-05-25 DIAGNOSIS — Z7984 Long term (current) use of oral hypoglycemic drugs: Secondary | ICD-10-CM | POA: Diagnosis not present

## 2023-05-25 DIAGNOSIS — Z7982 Long term (current) use of aspirin: Secondary | ICD-10-CM | POA: Insufficient documentation

## 2023-05-25 DIAGNOSIS — E860 Dehydration: Secondary | ICD-10-CM | POA: Insufficient documentation

## 2023-05-25 DIAGNOSIS — U071 COVID-19: Secondary | ICD-10-CM | POA: Diagnosis not present

## 2023-05-25 DIAGNOSIS — E119 Type 2 diabetes mellitus without complications: Secondary | ICD-10-CM | POA: Insufficient documentation

## 2023-05-25 DIAGNOSIS — R112 Nausea with vomiting, unspecified: Secondary | ICD-10-CM | POA: Diagnosis present

## 2023-05-25 DIAGNOSIS — R5383 Other fatigue: Secondary | ICD-10-CM | POA: Insufficient documentation

## 2023-05-25 LAB — COMPREHENSIVE METABOLIC PANEL
ALT: 15 U/L (ref 0–44)
AST: 27 U/L (ref 15–41)
Albumin: 3.8 g/dL (ref 3.5–5.0)
Alkaline Phosphatase: 57 U/L (ref 38–126)
Anion gap: 11 (ref 5–15)
BUN: 14 mg/dL (ref 6–20)
CO2: 22 mmol/L (ref 22–32)
Calcium: 9.3 mg/dL (ref 8.9–10.3)
Chloride: 102 mmol/L (ref 98–111)
Creatinine, Ser: 0.94 mg/dL (ref 0.44–1.00)
GFR, Estimated: >60 mL/min (ref >60)
Glucose, Bld: 139 mg/dL — ABNORMAL HIGH (ref 70–99)
Potassium: 3.9 mmol/L (ref 3.5–5.1)
Sodium: 135 mmol/L (ref 135–145)
Total Bilirubin: 0.7 mg/dL (ref 0.3–1.2)
Total Protein: 8.3 g/dL — ABNORMAL HIGH (ref 6.5–8.1)

## 2023-05-25 LAB — URINALYSIS, ROUTINE W REFLEX MICROSCOPIC
Bilirubin Urine: NEGATIVE
Glucose, UA: NEGATIVE mg/dL
Hgb urine dipstick: NEGATIVE
Ketones, ur: NEGATIVE mg/dL
Leukocytes,Ua: NEGATIVE
Nitrite: NEGATIVE
Protein, ur: NEGATIVE mg/dL
Specific Gravity, Urine: 1.02 (ref 1.005–1.030)
pH: 6 (ref 5.0–8.0)

## 2023-05-25 LAB — CBC
HCT: 36.8 % (ref 36.0–46.0)
Hemoglobin: 12.5 g/dL (ref 12.0–15.0)
MCH: 26.6 pg (ref 26.0–34.0)
MCHC: 34 g/dL (ref 30.0–36.0)
MCV: 78.3 fL — ABNORMAL LOW (ref 80.0–100.0)
Platelets: 263 10*3/uL (ref 150–400)
RBC: 4.7 MIL/uL (ref 3.87–5.11)
RDW: 11.9 % (ref 11.5–15.5)
WBC: 7.8 10*3/uL (ref 4.0–10.5)
nRBC: 0 % (ref 0.0–0.2)

## 2023-05-25 LAB — CK: Total CK: 222 U/L (ref 38–234)

## 2023-05-25 LAB — LIPASE, BLOOD: Lipase: 42 U/L (ref 11–51)

## 2023-05-25 LAB — TROPONIN I (HIGH SENSITIVITY)
Troponin I (High Sensitivity): <2 ng/L (ref <18)
Troponin I (High Sensitivity): <2 ng/L (ref <18)

## 2023-05-25 LAB — PREGNANCY, URINE: Preg Test, Ur: NEGATIVE

## 2023-05-25 LAB — TSH: TSH: 0.917 u[IU]/mL (ref 0.350–4.500)

## 2023-05-25 LAB — SARS CORONAVIRUS 2 BY RT PCR: SARS Coronavirus 2 by RT PCR: POSITIVE — AB

## 2023-05-25 MED ORDER — SODIUM CHLORIDE 0.9 % IV BOLUS
1000.0000 mL | Freq: Once | INTRAVENOUS | Status: AC
  Administered 2023-05-25: 1000 mL via INTRAVENOUS

## 2023-05-25 MED ORDER — ONDANSETRON HCL 4 MG/2ML IJ SOLN
4.0000 mg | Freq: Once | INTRAMUSCULAR | Status: AC | PRN
  Administered 2023-05-25: 4 mg via INTRAVENOUS
  Filled 2023-05-25: qty 2

## 2023-05-25 MED ORDER — ONDANSETRON 4 MG PO TBDP: 4.0000 mg | ORAL_TABLET | Freq: Three times a day (TID) | ORAL | 0 refills | Status: DC | PRN

## 2023-05-25 MED ORDER — ONDANSETRON HCL 4 MG/2ML IJ SOLN
4.0000 mg | Freq: Once | INTRAMUSCULAR | Status: AC
  Administered 2023-05-25: 4 mg via INTRAVENOUS
  Filled 2023-05-25: qty 2

## 2023-05-25 NOTE — ED Triage Notes (Signed)
Pt reports nausea that began this morning. 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 Pt has not vomited. Pt also endorses gas in her stomach. Denies diarrhea/constipation. Denies fevers at home. "I just don't feel good".

## 2023-05-25 NOTE — ED Provider Notes (Signed)
[place] [facility] Provider Note   CSN: [id number] Arrival date & time: 05/25/23  4696     History {Add pertinent medical, surgical, social history, OB history to HPI:1} Chief Complaint  Patient presents with   Emesis    Julie [name] is a 57 y.o. female.  HPI     57yo female with history of hypertension, DM, hyperlipidemia, rotator cuff syndrome, presents with concern for nausea and vomiting.  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  Today woke up feeling fatigued, nauseas--on arrival to ED had 2 episodes of emesis. Tried taking pepto bismol at home. No fever, no abdominal pain, no diarrhea or constipation. Stool has been normal color.  No chest pain or dyspnea, no headache, no sore throat or congestion.  Has pain from arthritis, scheduled for shoulder surgery.  Recently was on amoxicillin for dental procedure and the fluconazole for yeast completed on Monday.  No other new medications or stopped medications. Is on ozempic, dose decreased however not recently.   Past Medical History:  Diagnosis Date   Diabetes mellitus without complication (HCC)    Hypertension    Hypokalemia    Obesity    Rotator cuff syndrome of right shoulder      Home Medications Prior to Admission medications   Medication Sig Start Date End Date Taking? Authorizing Provider  alum & mag hydroxide-simeth (MAALOX ADVANCED MAX ST) 400-400-40 MG/5ML suspension Take 10 mLs by mouth every 6 (six) hours as needed for indigestion. 10/12/20   Nira Conn, MD  amLODipine (NORVASC) 5 MG tablet Take 5 mg by mouth daily. 04/08/20   [provider]  aspirin 81 MG chewable tablet Chew by mouth daily.    [provider]  cetirizine (ZYRTEC) 10 MG tablet Take 10 mg by mouth daily as needed. 04/16/20   [provider]  glimepiride (AMARYL) 1 MG tablet Take 1 mg by mouth daily.  04/06/19   [provider]  lidocaine (XYLOCAINE) 2 % solution Use as directed 15 mLs in the mouth or throat every 6 (six) hours as needed for mouth pain. 10/12/20   Nira Conn, MD  loratadine (CLARITIN) 10 MG tablet Take 1 tablet (10 mg total) by mouth daily. 01/10/16   Palumbo, April, MD  losartan (COZAAR) 100 MG tablet Take 100 mg by mouth daily. 03/27/20   [provider]  losartan (COZAAR) 25 MG tablet Take 25 mg by mouth daily.    [provider]  meloxicam (MOBIC) 15 MG tablet Take 1 tablet (15 mg total) by mouth daily. 04/06/23   Roemhildt, Lorin T, PA-C  metoprolol succinate (TOPROL-XL) 100 MG 24 hr tablet Take 100 mg by mouth daily. 06/26/19   [provider]  OZEMPIC, 0.25 OR 0.5 MG/DOSE, 2 MG/1.5ML SOPN Inject into the skin. 02/23/20   [provider]  pantoprazole (PROTONIX) 20 MG tablet Take 1 tablet (20 mg total) by mouth daily. 05/21/20   Couture, Cortni S, PA-C  ranitidine (ZANTAC) 150 MG capsule Take 150 mg by mouth every evening.    [provider]      Allergies    Metformin    Review of Systems   Review of Systems  Physical Exam Updated Vital Signs BP (!) 138/92   Pulse (!) 123   Temp 98.2 F (36.8 C)   Resp 18   Ht 5\' 4"  (1.626 m)   Wt 103  kg   SpO2 99%   BMI 38.98 kg/m  Physical Exam Vitals and nursing note reviewed.  Constitutional:      General: She is not in acute distress.    Appearance: She is well-developed. She is not diaphoretic.  HENT:     Head: Normocephalic and atraumatic.  Eyes:     Conjunctiva/sclera: Conjunctivae normal.  Cardiovascular:     Rate and Rhythm: Regular rhythm. Tachycardia present.     Heart sounds: Normal heart sounds. No murmur heard.    No friction rub. No gallop.  Pulmonary:     Effort: Pulmonary effort is normal. No respiratory distress.     Breath sounds: Normal breath sounds. No wheezing or rales.  Abdominal:     General: There is no distension.      Palpations: Abdomen is soft.     Tenderness: There is no abdominal tenderness. There is no guarding.  Musculoskeletal:        General: No tenderness.     Cervical back: Normal range of motion.  Skin:    General: Skin is warm and dry.     Findings: No erythema or rash.  Neurological:     Mental Status: She is alert and oriented to person, place, and time.     ED Results / Procedures / Treatments   Labs (all labs ordered are listed, but only abnormal results are displayed) Labs Reviewed  COMPREHENSIVE METABOLIC PANEL - Abnormal; Notable for the following components:      Result Value   Glucose, Bld 139 (*)    Total Protein 8.3 (*)    All other components within normal limits  CBC - Abnormal; Notable for the following components:   MCV 78.3 (*)    All other components within normal limits  LIPASE, BLOOD  URINALYSIS, ROUTINE W REFLEX MICROSCOPIC  PREGNANCY, URINE  TSH  CK  TROPONIN I (HIGH SENSITIVITY)    EKG EKG Interpretation Date/Time:  Wednesday May 25 2023 05:56:37 EDT Ventricular Rate:  114 PR Interval:  150 QRS Duration:  80 QT Interval:  352 QTC Calculation: 485 R Axis:   29  Text Interpretation: Sinus tachycardia Borderline T abnormalities, anterior leads Sinus tachycardia Confirmed by [name] [phone]) on 05/25/2023 6:30:05 AM  Radiology No results found.  Procedures Procedures  {Document cardiac monitor, telemetry assessment procedure when appropriate:1}  Medications Ordered in ED Medications  ondansetron (ZOFRAN) injection 4 mg (4 mg Intravenous Given 05/25/23 0548)  sodium chloride 0.9 % bolus 1,000 mL (1,000 mLs Intravenous New Bag/Given 05/25/23 0701)  ondansetron (ZOFRAN) injection 4 mg (4 mg Intravenous Given 05/25/23 0701)    ED Course/ Medical Decision Making/ A&P   {   Click here for ABCD2, HEART and other calculatorsREFRESH Note before signing :1}                              Medical Decision Making Amount and/or Complexity of Data  Reviewed Labs: ordered. ECG/medicine tests: ordered.  Risk Prescription drug management.    57yo female with history of hypertension, DM, hyperlipidemia, rotator cuff syndrome, presents with concern for fatigue, nausea and vomiting.  DDx includes DKA< electrolyte abnormality, hepatitis, pancreatitis, UTI, ACS, intracranial etiology, gastroparesis, withdrawal, rhabdomyolysis, other drug effect, viral syndrome, intraabdominal etiology, arrhythmia.  Abdominal exam benign, history and exam not consistent with SBO, perforation, appendicitis, diverticulitis, nephrolithiasis.  No dyspnea or chest pain to suggest PE, pneumonia, or aortic dissection.  EKG with sinus tachycardia,  borderline qtc.   Labs completed and personally about interpreted by me show a CK within normal limits, no signs of pancreatitis, no signs of hepatitis with normal transaminases, bilirubin and alk phos, no clinically significant electrolyte abnormalities or signs of DKA, no leukocytosis or anemia, pregnancy test negative, urinalysis without signs of infection, troponin within normal limits and have low suspicion for ACS.  Feeling improved with zofran and fluids ordered during MSE.   Suspect dehydration from working in heat leading to fatigue/nausea/tachycardia.  HR improved from 120s to 103.  Able to tolerate po.  Repeat troponin WNL ***.  Given rx for zofran, recommend PCP follow up and discussed reasons to return.    {Document critical care time when appropriate:1} {Document review of labs and clinical decision tools ie heart score, Chads2Vasc2 etc:1}  {Document your independent review of radiology images, and any outside records:1} {Document your discussion with family members, caretakers, and with consultants:1} {Document social determinants of health affecting pt's care:1} {Document your decision making why or why not admission, treatments were needed:1} Final Clinical Impression(s) / ED Diagnoses Final diagnoses:   None    Rx / DC Orders ED Discharge Orders     None

## 2023-05-25 NOTE — ED Provider Triage Note (Signed)
Emergency Medicine Provider Triage Evaluation Note  Julie [name] , a 57 y.o. female  was evaluated in triage.  Pt complains of nausea and vomiting since waking this morning.  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  Woke up today feeling nauseous and 1 episode of vomiting.  No chest pain, shortness of breath, cough, fever.  No abdominal pain.  History of diabetes and hypertension..  Review of Systems  Positive: Fatigue, nausea, vomiting. Negative: Chest pain, shortness of breath, abdominal pain  Physical Exam  BP (!) 138/92   Pulse (!) 123   Temp 98.2 F (36.8 C)   Resp 18   Ht 5\' 4"  (1.626 m)   Wt 103 kg   SpO2 99%   BMI 38.98 kg/m  Gen:   Awake, no distress   Resp:  Normal effort  MSK:   Moves extremities without difficulty  Other:  Tachycardia 120s  Medical Decision Making  Medically screening exam initiated at 6:50 AM.  Appropriate orders placed.  Julie [name] was informed that the remainder of the evaluation will be completed by another provider, this initial triage assessment does not replace that evaluation, and the importance of remaining in the ED until their evaluation is complete.  Patient to be given IV fluids, labs to be obtained.  Will add on TSH and CK given her body aches and working hot environment.   Julie Octave, MD 05/25/23 [phone]

## 2024-03-25 ENCOUNTER — Encounter (HOSPITAL_BASED_OUTPATIENT_CLINIC_OR_DEPARTMENT_OTHER): Payer: Self-pay

## 2024-03-25 ENCOUNTER — Other Ambulatory Visit: Payer: Self-pay

## 2024-03-25 ENCOUNTER — Emergency Department (HOSPITAL_BASED_OUTPATIENT_CLINIC_OR_DEPARTMENT_OTHER)
Admission: EM | Admit: 2024-03-25 | Discharge: 2024-03-25 | Disposition: A | Discharge status: Home / Self Care | Attending: Emergency Medicine | Admitting: Emergency Medicine

## 2024-03-25 DIAGNOSIS — M545 Low back pain, unspecified: Secondary | ICD-10-CM | POA: Insufficient documentation

## 2024-03-25 DIAGNOSIS — Z7982 Long term (current) use of aspirin: Secondary | ICD-10-CM | POA: Insufficient documentation

## 2024-03-25 MED ORDER — ACETAMINOPHEN 500 MG PO TABS
1000.0000 mg | ORAL_TABLET | Freq: Once | ORAL | Status: AC
  Administered 2024-03-25: 1000 mg via ORAL
  Filled 2024-03-25: qty 2

## 2024-03-25 MED ORDER — DICLOFENAC SODIUM 1 % EX GEL: 4.0000 g | Freq: Four times a day (QID) | CUTANEOUS | 0 refills | Status: AC

## 2024-03-25 MED ORDER — KETOROLAC TROMETHAMINE 15 MG/ML IJ SOLN
15.0000 mg | Freq: Once | INTRAMUSCULAR | Status: AC
  Administered 2024-03-25: 15 mg via INTRAMUSCULAR
  Filled 2024-03-25: qty 1

## 2024-03-25 MED ORDER — METHYLPREDNISOLONE 4 MG PO TBPK: ORAL_TABLET | ORAL | 0 refills | Status: AC

## 2024-03-25 MED ORDER — METHOCARBAMOL 500 MG PO TABS: 500.0000 mg | ORAL_TABLET | Freq: Two times a day (BID) | ORAL | 0 refills | Status: AC

## 2024-03-25 NOTE — ED Provider Notes (Signed)
 [place] [facility] Provider Note   CSN: [id number] Arrival date & time: 03/25/24  0746     History  Chief Complaint  Patient presents with   Flank Pain    Julie [name] is a 58 y.o. female.  58 yo F with a chief complaints of right-sided low back pain.  This is worse with twisting turning palpation and attempting to stand.  She denies trauma.  She decided to mop the floor yesterday and felt some pain while she was doing it.  She denies radiation of the pain.  Denies loss of bowel or bladder denies loss of sensation denies trauma.   Flank Pain       Home Medications Prior to Admission medications   Medication Sig Start Date End Date Taking? Authorizing Provider  diclofenac  Sodium (VOLTAREN ) 1 % GEL Apply 4 g topically 4 (four) times daily. 03/25/24  Yes Albertus Hughs, DO  methocarbamol  (ROBAXIN ) 500 MG tablet Take 1 tablet (500 mg total) by mouth 2 (two) times daily. 03/25/24  Yes Jaylee Freeze, DO  methylPREDNISolone (MEDROL DOSEPAK) 4 MG TBPK tablet Day 1: 8mg  before breakfast, 4 mg after lunch, 4 mg after supper, and 8 mg at bedtime Day 2: 4 mg before breakfast, 4 mg after lunch, 4 mg  after supper, and 8 mg  at bedtime Day 3:  4 mg  before breakfast, 4 mg  after lunch, 4 mg after supper, and 4 mg  at bedtime Day 4: 4 mg  before breakfast, 4 mg  after lunch, and 4 mg at bedtime Day 5: 4 mg  before breakfast and 4 mg at bedtime Day 6: 4 mg  before breakfast 03/25/24  Yes Crandall Harvel, DO  alum & mag hydroxide-simeth (MAALOX ADVANCED MAX ST) 400-400-40 MG/5ML suspension Take 10 mLs by mouth every 6 (six) hours as needed for indigestion. 10/12/20   Lindle Rhea, MD  amLODipine (NORVASC) 5 MG tablet Take 5 mg by mouth daily. 04/08/20   [provider]  aspirin 81 MG chewable tablet Chew by mouth daily.    [provider]  cetirizine (ZYRTEC) 10 MG tablet Take 10 mg by mouth daily as needed. 04/16/20   [provider]   glimepiride (AMARYL) 1 MG tablet Take 1 mg by mouth daily. 04/06/19   [provider]  lidocaine  (XYLOCAINE ) 2 % solution Use as directed 15 mLs in the mouth or throat every 6 (six) hours as needed for mouth pain. 10/12/20   Lindle Rhea, MD  loratadine  (CLARITIN ) 10 MG tablet Take 1 tablet (10 mg total) by mouth daily. 01/10/16   Palumbo, April, MD  losartan (COZAAR) 100 MG tablet Take 100 mg by mouth daily. 03/27/20   [provider]  losartan (COZAAR) 25 MG tablet Take 25 mg by mouth daily.    [provider]  meloxicam  (MOBIC ) 15 MG tablet Take 1 tablet (15 mg total) by mouth daily. 04/06/23   Roemhildt, Lorin T, PA-C  metoprolol succinate (TOPROL-XL) 100 MG 24 hr tablet Take 100 mg by mouth daily. 06/26/19   [provider]  ondansetron  (ZOFRAN -ODT) 4 MG disintegrating tablet Take 1 tablet (4 mg total) by mouth every 8 (eight) hours as needed for nausea or vomiting. 05/25/23   Scarlette Currier, MD  OZEMPIC, 0.25 OR 0.5 MG/DOSE, 2 MG/1.5ML SOPN Inject into the skin. 02/23/20   [provider]  pantoprazole  (PROTONIX ) 20 MG tablet Take 1 tablet (20 mg total) by mouth daily.  05/21/20   Couture, Cortni S, PA-C  ranitidine (ZANTAC) 150 MG capsule Take 150 mg by mouth every evening.    [provider]      Allergies    Metformin    Review of Systems   Review of Systems  Genitourinary:  Positive for flank pain.    Physical Exam Updated Vital Signs BP (!) 139/91   Pulse 77   Temp 97.9 F (36.6 C) (Oral)   Resp 18   Wt 103 kg   SpO2 100%   BMI 38.96 kg/m  Physical Exam Vitals and nursing note reviewed.  Constitutional:      General: She is not in acute distress.    Appearance: She is well-developed. She is not diaphoretic.  HENT:     Head: Normocephalic and atraumatic.  Eyes:     Pupils: Pupils are equal, round, and reactive to light.  Cardiovascular:     Rate and Rhythm: Normal rate and regular rhythm.     Heart sounds: No  murmur heard.    No friction rub. No gallop.  Pulmonary:     Effort: Pulmonary effort is normal.     Breath sounds: No wheezing or rales.  Abdominal:     General: There is no distension.     Palpations: Abdomen is soft.     Tenderness: There is no abdominal tenderness.  Musculoskeletal:        General: No tenderness.     Cervical back: Normal range of motion and neck supple.     Comments: Pulse motor and sensation intact to the right lower extremity.  Reflexes are 2+ and equal.  No clonus.  Skin:    General: Skin is warm and dry.  Neurological:     Mental Status: She is alert and oriented to person, place, and time.  Psychiatric:        Behavior: Behavior normal.     ED Results / Procedures / Treatments   Labs (all labs ordered are listed, but only abnormal results are displayed) Labs Reviewed - No data to display  EKG None  Radiology No results found.  Procedures Procedures    Medications Ordered in ED Medications  acetaminophen  (TYLENOL ) tablet 1,000 mg (1,000 mg Oral Given 03/25/24 0833)  ketorolac  (TORADOL ) 15 MG/ML injection 15 mg (15 mg Intramuscular Given 03/25/24 1610)    ED Course/ Medical Decision Making/ A&P                                 Medical Decision Making Risk OTC drugs. Prescription drug management.   58 yo F with a chief complaints of right lower back pain.  Likely musculoskeletal by history and physical.  Benign abdominal exam.  No signs or symptoms consistent with cauda equina.  Benign exam otherwise.  Will treat supportively.  PCP follow-up.  9:23 AM:  I have discussed the diagnosis/risks/treatment options with the patient.  Evaluation and diagnostic testing in the emergency department does not suggest an emergent condition requiring admission or immediate intervention beyond what has been performed at this time.  They will follow up with PCP. We also discussed returning to the ED immediately if new or worsening sx occur. We discussed the sx  which are most concerning (e.g., sudden worsening pain, fever, inability to tolerate by mouth, cauda equina s/sx) that necessitate immediate return. Medications administered to the patient during their visit and any new prescriptions provided to the patient  are listed below.  Medications given during this visit Medications  acetaminophen  (TYLENOL ) tablet 1,000 mg (1,000 mg Oral Given 03/25/24 0833)  ketorolac  (TORADOL ) 15 MG/ML injection 15 mg (15 mg Intramuscular Given 03/25/24 1610)     The patient appears reasonably screen and/or stabilized for discharge and I doubt any other medical condition or other [facility] requiring further screening, evaluation, or treatment in the ED at this time prior to discharge.          Final Clinical Impression(s) / ED Diagnoses Final diagnoses:  Acute right-sided low back pain without sciatica    Rx / DC Orders ED Discharge Orders          Ordered    methylPREDNISolone (MEDROL DOSEPAK) 4 MG TBPK tablet        03/25/24 0829    diclofenac  Sodium (VOLTAREN ) 1 % GEL  4 times daily        03/25/24 0829    methocarbamol  (ROBAXIN ) 500 MG tablet  2 times daily        03/25/24 0829              Albertus Hughs, DO 03/25/24 [phone]

## 2024-03-25 NOTE — ED Triage Notes (Signed)
 Reports R flank pain since yesterday. Pain is worse when twisting or moving. Denies urinary symptoms, NV.  States she thinks she pulled a muscle while mopping.

## 2024-03-25 NOTE — Discharge Instructions (Signed)

## 2024-10-24 ENCOUNTER — Emergency Department (HOSPITAL_BASED_OUTPATIENT_CLINIC_OR_DEPARTMENT_OTHER)

## 2024-10-24 ENCOUNTER — Encounter (HOSPITAL_BASED_OUTPATIENT_CLINIC_OR_DEPARTMENT_OTHER): Payer: Self-pay

## 2024-10-24 ENCOUNTER — Other Ambulatory Visit: Payer: Self-pay

## 2024-10-24 ENCOUNTER — Emergency Department (HOSPITAL_BASED_OUTPATIENT_CLINIC_OR_DEPARTMENT_OTHER)
Admission: EM | Admit: 2024-10-24 | Discharge: 2024-10-24 | Disposition: A | Discharge status: Home / Self Care | Attending: Emergency Medicine | Admitting: Emergency Medicine

## 2024-10-24 DIAGNOSIS — J069 Acute upper respiratory infection, unspecified: Secondary | ICD-10-CM | POA: Insufficient documentation

## 2024-10-24 DIAGNOSIS — R059 Cough, unspecified: Secondary | ICD-10-CM | POA: Diagnosis present

## 2024-10-24 MED ORDER — ALBUTEROL SULFATE HFA 108 (90 BASE) MCG/ACT IN AERS
2.0000 | INHALATION_SPRAY | Freq: Once | RESPIRATORY_TRACT | Status: AC
  Administered 2024-10-24: 2 via RESPIRATORY_TRACT
  Filled 2024-10-24: qty 6.7

## 2024-10-24 MED ORDER — ONDANSETRON 4 MG PO TBDP: ORAL_TABLET | ORAL | 0 refills | Status: AC

## 2024-10-24 MED ORDER — AEROCHAMBER PLUS FLO-VU MEDIUM MISC: 1.0000 | Freq: Once | Status: DC

## 2024-10-24 MED ORDER — DEXAMETHASONE 4 MG PO TABS
10.0000 mg | ORAL_TABLET | Freq: Once | ORAL | Status: AC
  Administered 2024-10-24: 10 mg via ORAL
  Filled 2024-10-24: qty 3

## 2024-10-24 MED ORDER — BENZONATATE 100 MG PO CAPS: 100.0000 mg | ORAL_CAPSULE | Freq: Three times a day (TID) | ORAL | 0 refills | Status: AC

## 2024-10-24 NOTE — Discharge Instructions (Signed)
 Take tylenol 2 pills 4 times a day and motrin 4 pills 3 times a day.  Drink plenty of fluids.  Return for worsening shortness of breath, headache, confusion. Follow up with your family doctor.

## 2024-10-24 NOTE — ED Provider Notes (Signed)
 " [place] [facility] Provider Note   CSN: [id number] Arrival date & time: 10/24/24  0300     Patient presents with: Cough, Nasal Congestion, and Wheezing   Julie [name] is a 58 y.o. female.   58 yo F with a chief complaints of cough congestion going on for about 4 days now.  People sick maybe at her work.  Otherwise denies sick contacts.  Denies fevers or chills.  Feels like she has been eating and drinking normally.  Feels like she is been wheezing at times.  Has an inhaler at home and has been using it with minimal relief.   Cough Associated symptoms: wheezing   Wheezing Associated symptoms: cough        Prior to Admission medications  Medication Sig Start Date End Date Taking? Authorizing Provider  benzonatate  (TESSALON ) 100 MG capsule Take 1 capsule (100 mg total) by mouth every 8 (eight) hours. 10/24/24  Yes Emil Share, DO  ondansetron  (ZOFRAN -ODT) 4 MG disintegrating tablet 4mg  ODT q4 hours prn nausea/vomit 10/24/24  Yes Kimothy Kishimoto, DO  alum & mag hydroxide-simeth (MAALOX ADVANCED MAX ST) 400-400-40 MG/5ML suspension Take 10 mLs by mouth every 6 (six) hours as needed for indigestion. 10/12/20   Trine Raynell Moder, MD  amLODipine (NORVASC) 5 MG tablet Take 5 mg by mouth daily. 04/08/20   [provider]  aspirin 81 MG chewable tablet Chew by mouth daily.    [provider]  cetirizine (ZYRTEC) 10 MG tablet Take 10 mg by mouth daily as needed. 04/16/20   [provider]  diclofenac  Sodium (VOLTAREN ) 1 % GEL Apply 4 g topically 4 (four) times daily. 03/25/24   Caide Campi, DO  glimepiride (AMARYL) 1 MG tablet Take 1 mg by mouth daily. 04/06/19   [provider]  lidocaine  (XYLOCAINE ) 2 % solution Use as directed 15 mLs in the mouth or throat every 6 (six) hours as needed for mouth pain. 10/12/20   Trine Raynell Moder, MD  loratadine  (CLARITIN ) 10 MG tablet Take 1 tablet (10 mg total) by mouth daily.  01/10/16   Palumbo, April, MD  losartan (COZAAR) 100 MG tablet Take 100 mg by mouth daily. 03/27/20   [provider]  losartan (COZAAR) 25 MG tablet Take 25 mg by mouth daily.    [provider]  meloxicam  (MOBIC ) 15 MG tablet Take 1 tablet (15 mg total) by mouth daily. 04/06/23   Roemhildt, Lorin T, PA-C  methocarbamol  (ROBAXIN ) 500 MG tablet Take 1 tablet (500 mg total) by mouth 2 (two) times daily. 03/25/24   Emil Share, DO  methylPREDNISolone  (MEDROL  DOSEPAK) 4 MG TBPK tablet Day 1: 8mg  before breakfast, 4 mg after lunch, 4 mg after supper, and 8 mg at bedtime Day 2: 4 mg before breakfast, 4 mg after lunch, 4 mg  after supper, and 8 mg  at bedtime Day 3:  4 mg  before breakfast, 4 mg  after lunch, 4 mg after supper, and 4 mg  at bedtime Day 4: 4 mg  before breakfast, 4 mg  after lunch, and 4 mg at bedtime Day 5: 4 mg  before breakfast and 4 mg at bedtime Day 6: 4 mg  before breakfast 03/25/24   [name], DO  metoprolol succinate (TOPROL-XL) 100 MG 24 hr tablet Take 100 mg by mouth daily. 06/26/19   [provider]  OZEMPIC, 0.25 OR 0.5 MG/DOSE, 2 MG/1.5ML SOPN Inject into the skin. 02/23/20   [provider]  pantoprazole  (PROTONIX ) 20 MG tablet Take 1 tablet (20 mg total) by mouth daily. 05/21/20   Couture, Cortni S, PA-C  ranitidine (ZANTAC) 150 MG capsule Take 150 mg by mouth every evening.    [provider]    Allergies: Metformin    Review of Systems  Respiratory:  Positive for cough and wheezing.     Updated Vital Signs BP (!) 147/92 (BP Location: Right Arm)   Pulse 76   Temp 98.4 F (36.9 C) (Oral)   Resp 18   Ht 5' 4 (1.626 m)   Wt 103 kg   SpO2 100%   BMI 38.96 kg/m   Physical Exam Vitals and nursing note reviewed.  Constitutional:      General: She is not in acute distress.    Appearance: She is well-developed. She is not diaphoretic.  HENT:     Head: Normocephalic and atraumatic.     Comments: Swollen turbinates, posterior nasal  drip, no noted sinus ttp, tm normal bilaterally.   Eyes:     Pupils: Pupils are equal, round, and reactive to light.  Cardiovascular:     Rate and Rhythm: Normal rate and regular rhythm.     Heart sounds: No murmur heard.    No friction rub. No gallop.  Pulmonary:     Effort: Pulmonary effort is normal.     Breath sounds: No wheezing or rales.  Abdominal:     General: There is no distension.     Palpations: Abdomen is soft.     Tenderness: There is no abdominal tenderness.  Musculoskeletal:        General: No tenderness.     Cervical back: Normal range of motion and neck supple.  Skin:    General: Skin is warm and dry.  Neurological:     Mental Status: She is alert and oriented to person, place, and time.  Psychiatric:        Behavior: Behavior normal.     (all labs ordered are listed, but only abnormal results are displayed) Labs Reviewed - No data to display  EKG: None  Radiology: No results found.   Procedures   Medications Ordered in the ED  dexamethasone (DECADRON) tablet 10 mg (has no administration in time range)  albuterol  (VENTOLIN  HFA) 108 (90 Base) MCG/ACT inhaler 2 puff (has no administration in time range)  AeroChamber Plus Flo-Vu Medium MISC 1 each (has no administration in time range)                                    Medical Decision Making Amount and/or Complexity of Data Reviewed Radiology: ordered.  Risk Prescription drug management.   58 yo F with a chief complaint of cough and congestion.  Going on for about 4 days now.  She is well-appearing nontoxic.  Appears well-hydrated.  Clear lung sounds for me without wheezes no tachypnea.  She had a chest x-ray performed through the triage process.  My independent interpretation no obvious focal infiltrate or pneumothorax.  Will have her follow-up with her family doctor in the office.  3:32 AM:  I have discussed the diagnosis/risks/treatment options with the patient and family.  Evaluation and  diagnostic testing in the emergency department does not suggest an emergent condition requiring admission or immediate intervention beyond what has been performed at this time.  They will follow up with PCP. We also discussed returning to  the ED immediately if new or worsening sx occur. We discussed the sx which are most concerning (e.g., sudden worsening pain, fever, inability to tolerate by mouth) that necessitate immediate return. Medications administered to the patient during their visit and any new prescriptions provided to the patient are listed below.  Medications given during this visit Medications  dexamethasone (DECADRON) tablet 10 mg (has no administration in time range)  albuterol  (VENTOLIN  HFA) 108 (90 Base) MCG/ACT inhaler 2 puff (has no administration in time range)  AeroChamber Plus Flo-Vu Medium MISC 1 each (has no administration in time range)     The patient appears reasonably screen and/or stabilized for discharge and I doubt any other medical condition or other [facility] requiring further screening, evaluation, or treatment in the ED at this time prior to discharge.       Final diagnoses:  Viral URI with cough    ED Discharge Orders          Ordered    benzonatate  (TESSALON ) 100 MG capsule  Every 8 hours        10/24/24 0330    ondansetron  (ZOFRAN -ODT) 4 MG disintegrating tablet        10/24/24 0330               Emil Share, DO 10/24/24 [id number]  "

## 2024-10-24 NOTE — ED Notes (Signed)
 Patient transported to X-ray

## 2024-10-24 NOTE — ED Triage Notes (Signed)
 Patient reports worsening cough, nasal congestion, and wheezing that started four days ago.  Patient states she has been taking cough medicine, a home inhaler, and allergy medicine but it has not helped her symptoms. PMH: DM 2, HTN  AOX4, NAD noted, VSS, ambulates with steady gait.
